# Patient Record
Sex: Female | Born: 1975 | Race: Black or African American | Hispanic: No | Smoking: Never smoker
Health system: Southern US, Community
[De-identification: ages and names within clinical notes are randomized; demographics above are authoritative.]

## PROBLEM LIST (undated history)

## (undated) DIAGNOSIS — E282 Polycystic ovarian syndrome: Secondary | ICD-10-CM

## (undated) DIAGNOSIS — R7303 Prediabetes: Secondary | ICD-10-CM

## (undated) DIAGNOSIS — K219 Gastro-esophageal reflux disease without esophagitis: Secondary | ICD-10-CM

## (undated) DIAGNOSIS — R0789 Other chest pain: Secondary | ICD-10-CM

## (undated) DIAGNOSIS — Z5189 Encounter for other specified aftercare: Secondary | ICD-10-CM

## (undated) DIAGNOSIS — I10 Essential (primary) hypertension: Secondary | ICD-10-CM

## (undated) HISTORY — PX: ECTOPIC PREGNANCY SURGERY: SHX613

## (undated) HISTORY — DX: Prediabetes: R73.03

## (undated) HISTORY — PX: DIAGNOSTIC LAPAROSCOPY: SUR761

## (undated) HISTORY — DX: Morbid (severe) obesity due to excess calories: E66.01

---

## 2002-08-10 ENCOUNTER — Encounter: Payer: Self-pay | Admitting: Obstetrics and Gynecology

## 2002-08-10 ENCOUNTER — Inpatient Hospital Stay (HOSPITAL_COMMUNITY): Admission: AD | Admit: 2002-08-10 | Discharge: 2002-08-10 | Payer: Self-pay | Admitting: Obstetrics and Gynecology

## 2002-08-12 ENCOUNTER — Encounter: Admission: RE | Admit: 2002-08-12 | Discharge: 2002-08-16 | Payer: Self-pay | Admitting: Obstetrics and Gynecology

## 2002-08-16 ENCOUNTER — Encounter (HOSPITAL_COMMUNITY): Admission: RE | Admit: 2002-08-16 | Discharge: 2002-08-27 | Payer: Self-pay | Admitting: *Deleted

## 2002-08-16 ENCOUNTER — Encounter: Payer: Self-pay | Admitting: *Deleted

## 2002-08-19 ENCOUNTER — Encounter: Payer: Self-pay | Admitting: *Deleted

## 2002-08-25 ENCOUNTER — Inpatient Hospital Stay (HOSPITAL_COMMUNITY): Admission: AD | Admit: 2002-08-25 | Discharge: 2002-08-25 | Payer: Self-pay | Admitting: *Deleted

## 2002-08-26 ENCOUNTER — Encounter: Payer: Self-pay | Admitting: *Deleted

## 2002-08-26 ENCOUNTER — Inpatient Hospital Stay (HOSPITAL_COMMUNITY): Admission: RE | Admit: 2002-08-26 | Discharge: 2002-08-29 | Payer: Self-pay | Admitting: *Deleted

## 2002-09-22 ENCOUNTER — Inpatient Hospital Stay (HOSPITAL_COMMUNITY): Admission: AD | Admit: 2002-09-22 | Discharge: 2002-09-22 | Payer: Self-pay | Admitting: *Deleted

## 2003-04-09 ENCOUNTER — Encounter (INDEPENDENT_AMBULATORY_CARE_PROVIDER_SITE_OTHER): Payer: Self-pay | Admitting: *Deleted

## 2003-04-09 LAB — CONVERTED CEMR LAB

## 2003-04-13 ENCOUNTER — Encounter (INDEPENDENT_AMBULATORY_CARE_PROVIDER_SITE_OTHER): Payer: Self-pay | Admitting: Specialist

## 2003-04-13 ENCOUNTER — Other Ambulatory Visit: Admission: RE | Admit: 2003-04-13 | Discharge: 2003-04-13 | Payer: Self-pay | Admitting: Obstetrics & Gynecology

## 2003-04-13 ENCOUNTER — Encounter: Payer: Self-pay | Admitting: Obstetrics & Gynecology

## 2003-04-13 ENCOUNTER — Inpatient Hospital Stay (HOSPITAL_COMMUNITY): Admission: AD | Admit: 2003-04-13 | Discharge: 2003-04-13 | Payer: Self-pay | Admitting: *Deleted

## 2003-04-18 ENCOUNTER — Encounter (INDEPENDENT_AMBULATORY_CARE_PROVIDER_SITE_OTHER): Payer: Self-pay

## 2003-04-18 ENCOUNTER — Encounter: Admission: RE | Admit: 2003-04-18 | Discharge: 2003-04-18 | Payer: Self-pay | Admitting: *Deleted

## 2003-05-04 ENCOUNTER — Encounter: Admission: RE | Admit: 2003-05-04 | Discharge: 2003-08-02 | Payer: Self-pay | Admitting: *Deleted

## 2003-05-18 ENCOUNTER — Encounter: Admission: RE | Admit: 2003-05-18 | Discharge: 2003-05-18 | Payer: Self-pay | Admitting: Family Medicine

## 2003-06-08 ENCOUNTER — Encounter: Admission: RE | Admit: 2003-06-08 | Discharge: 2003-06-08 | Payer: Self-pay | Admitting: *Deleted

## 2003-06-15 ENCOUNTER — Encounter: Admission: RE | Admit: 2003-06-15 | Discharge: 2003-06-15 | Payer: Self-pay | Admitting: Family Medicine

## 2003-06-20 ENCOUNTER — Ambulatory Visit (HOSPITAL_COMMUNITY): Admission: RE | Admit: 2003-06-20 | Discharge: 2003-06-20 | Payer: Self-pay | Admitting: Family Medicine

## 2003-06-29 ENCOUNTER — Encounter: Admission: RE | Admit: 2003-06-29 | Discharge: 2003-06-29 | Payer: Self-pay | Admitting: *Deleted

## 2003-07-07 ENCOUNTER — Encounter: Admission: RE | Admit: 2003-07-07 | Discharge: 2003-07-07 | Payer: Self-pay | Admitting: Family Medicine

## 2003-08-17 ENCOUNTER — Inpatient Hospital Stay (HOSPITAL_COMMUNITY): Admission: RE | Admit: 2003-08-17 | Discharge: 2003-08-17 | Payer: Self-pay | Admitting: Family Medicine

## 2003-08-17 ENCOUNTER — Encounter: Admission: RE | Admit: 2003-08-17 | Discharge: 2003-08-17 | Payer: Self-pay | Admitting: *Deleted

## 2003-08-21 ENCOUNTER — Encounter: Admission: RE | Admit: 2003-08-21 | Discharge: 2003-08-21 | Payer: Self-pay | Admitting: *Deleted

## 2003-08-24 ENCOUNTER — Inpatient Hospital Stay (HOSPITAL_COMMUNITY): Admission: AD | Admit: 2003-08-24 | Discharge: 2003-08-26 | Payer: Self-pay | Admitting: Family Medicine

## 2003-08-24 ENCOUNTER — Encounter: Admission: RE | Admit: 2003-08-24 | Discharge: 2003-08-24 | Payer: Self-pay | Admitting: *Deleted

## 2003-10-23 ENCOUNTER — Encounter: Admission: RE | Admit: 2003-10-23 | Discharge: 2003-10-23 | Payer: Self-pay | Admitting: Family Medicine

## 2003-12-06 ENCOUNTER — Encounter: Admission: RE | Admit: 2003-12-06 | Discharge: 2003-12-06 | Payer: Self-pay | Admitting: Family Medicine

## 2003-12-15 ENCOUNTER — Encounter: Admission: RE | Admit: 2003-12-15 | Discharge: 2003-12-15 | Payer: Self-pay | Admitting: Family Medicine

## 2003-12-20 ENCOUNTER — Encounter: Admission: RE | Admit: 2003-12-20 | Discharge: 2003-12-20 | Payer: Self-pay | Admitting: Family Medicine

## 2003-12-20 IMAGING — US US OB FOLLOW-UP
1 series · 13 of 28 positions shown · non-contrast
Comparison: none

CLINICAL DATA: 39 week 3 day assigned gestational age.  Gestational diabetes.  Evaluate growth and biophysical profile.

[Series 1: unknown · 13 of 33 slices shown]
[im 2/33]
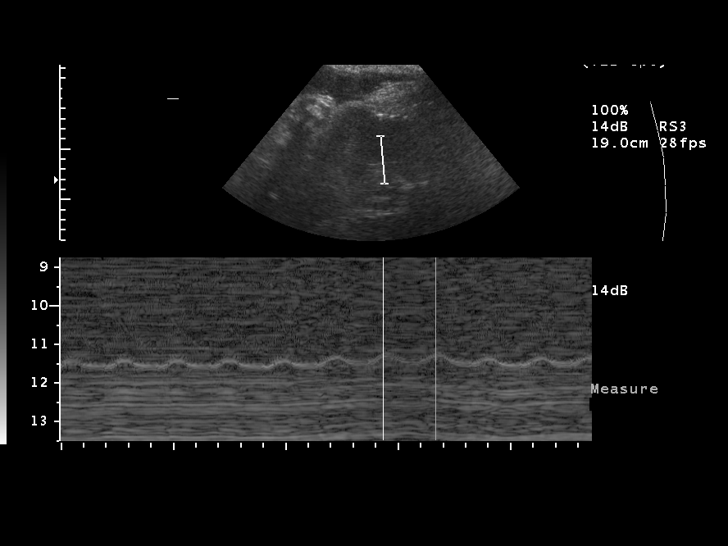
[im 4/33]
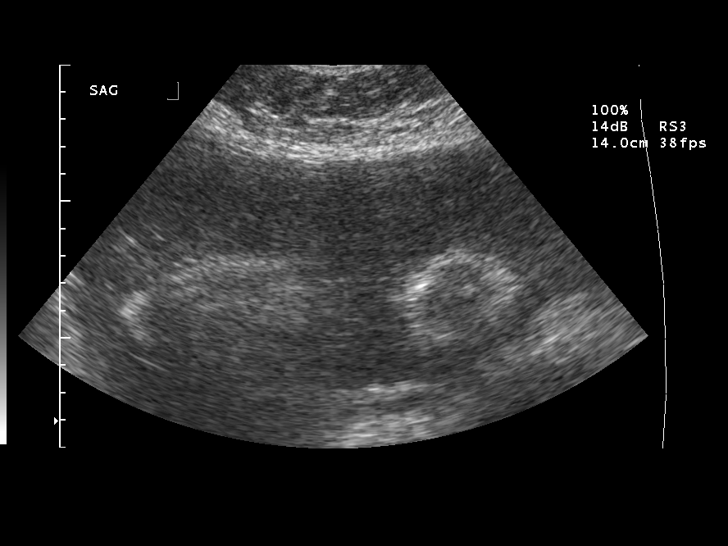
[im 6/33]
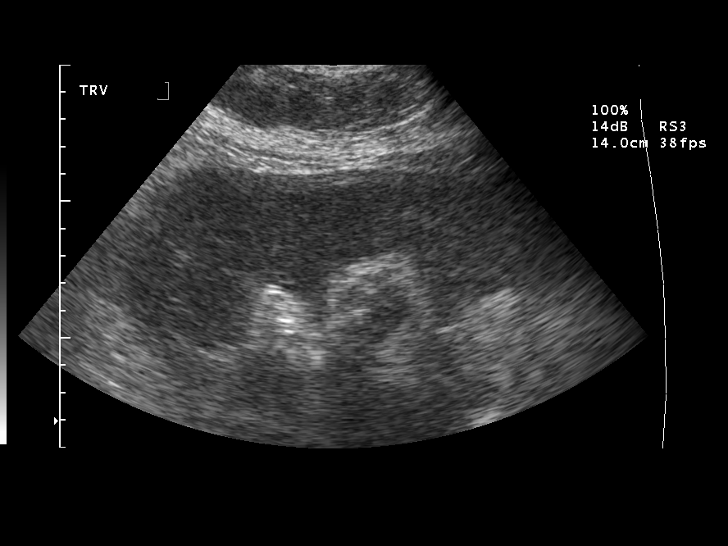
[im 9/33]
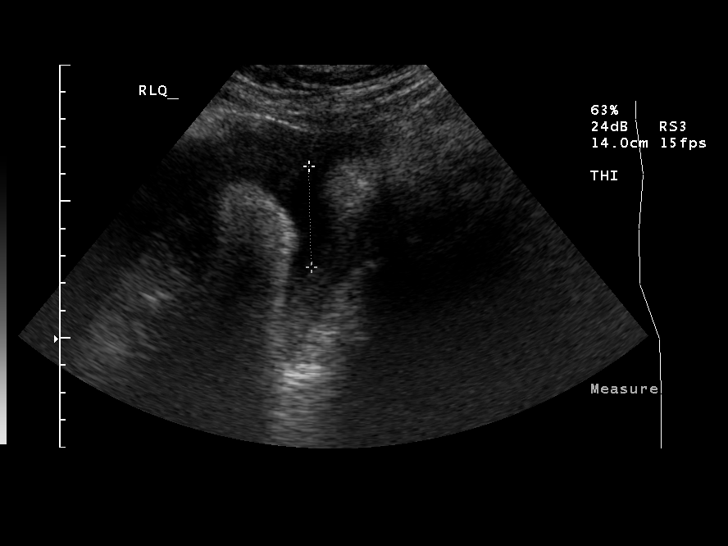
[im 11/33]
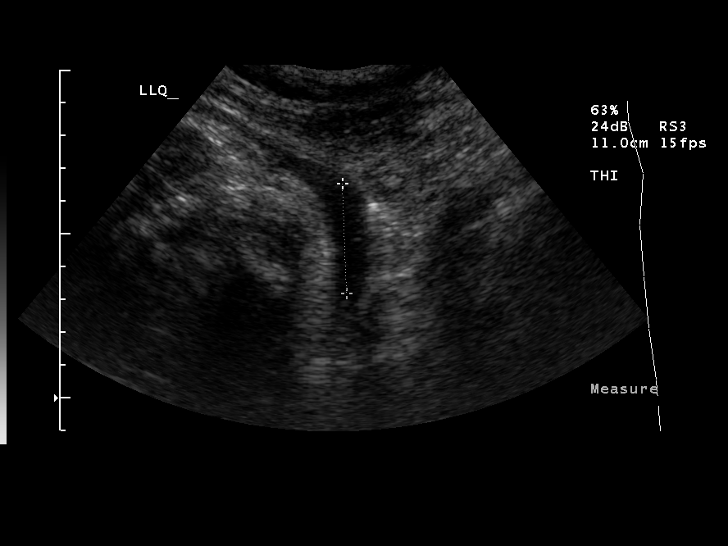
[im 14/33]
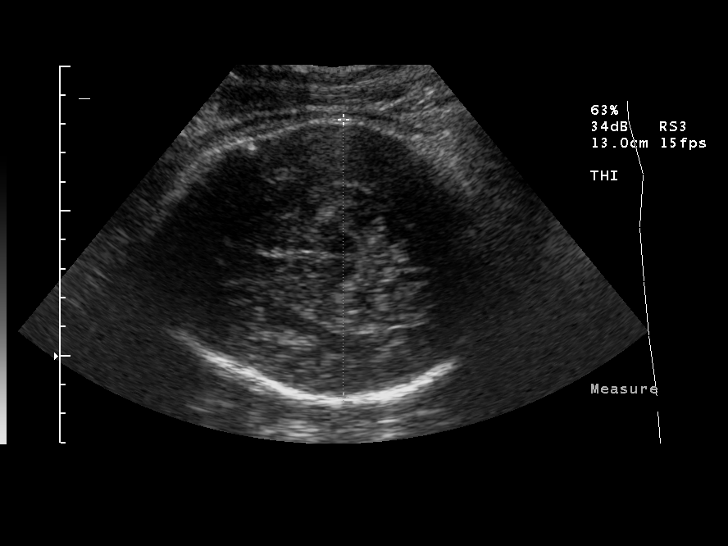
[im 17/33]
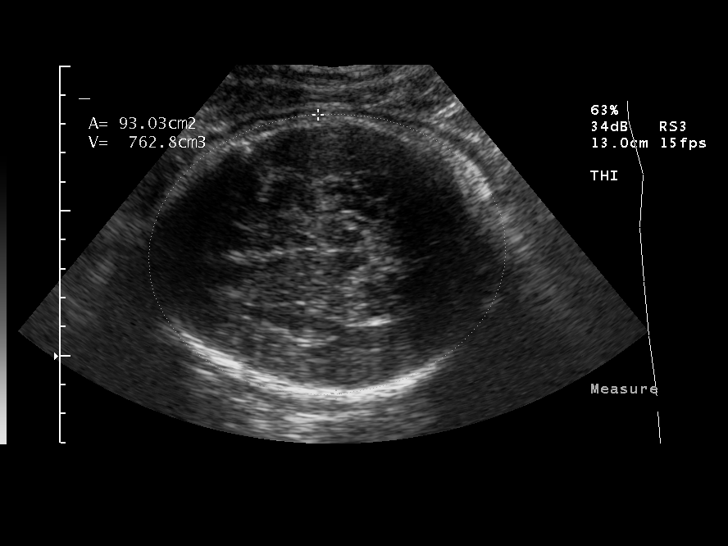
[im 19/33]
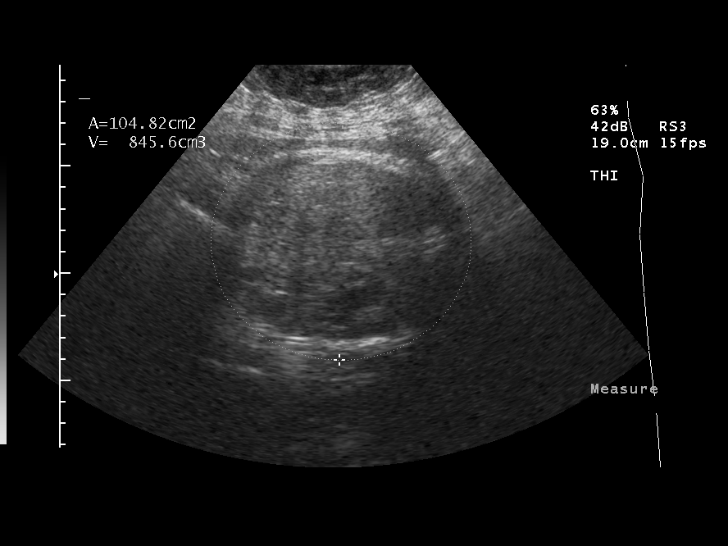
[im 22/33]
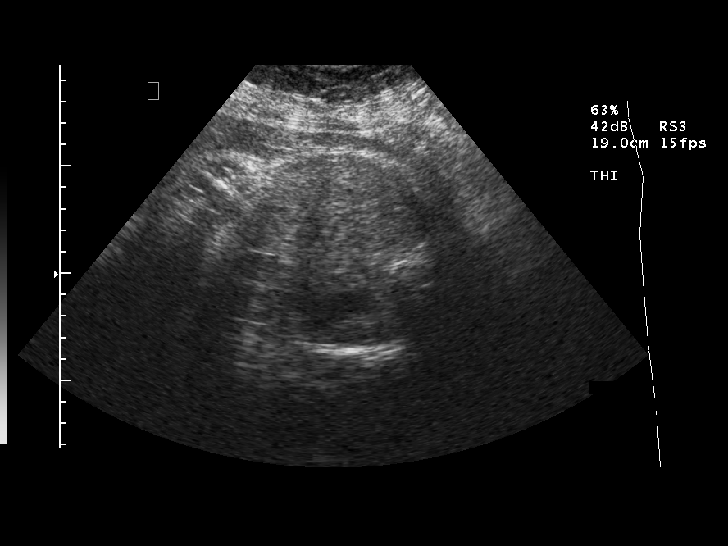
[im 24/33]
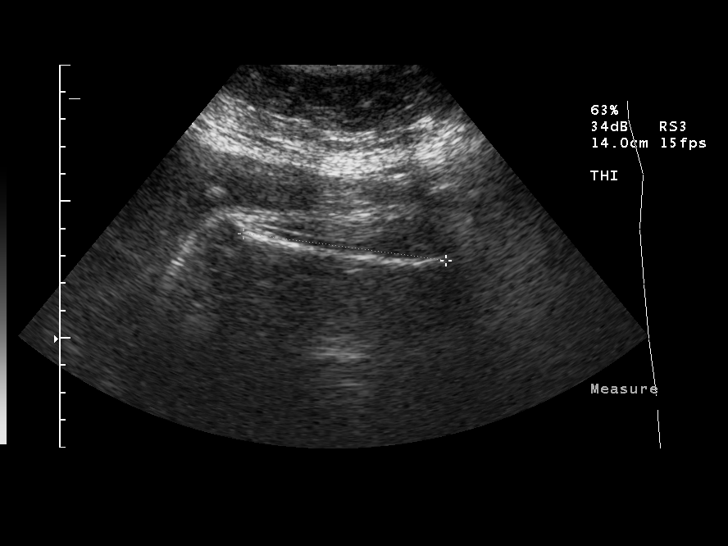
[im 27/33]
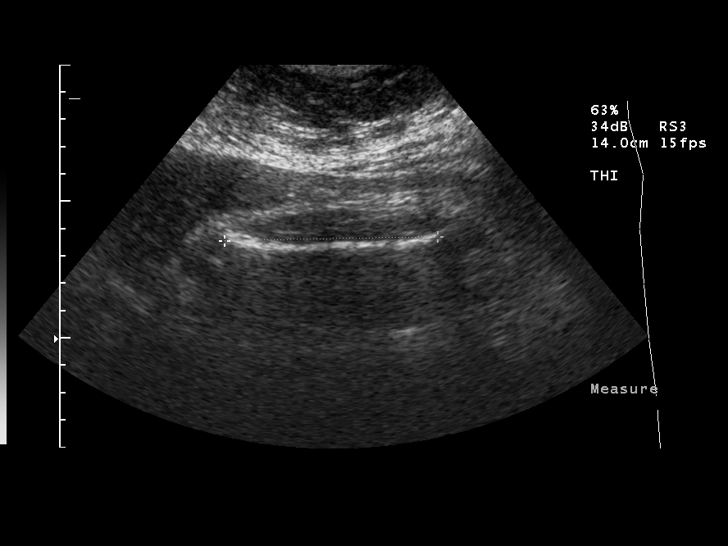
[im 29/33]
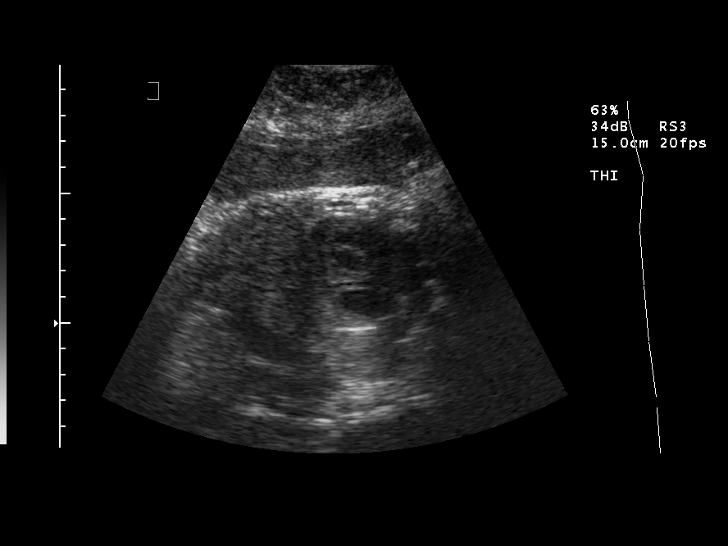
[im 31/33]
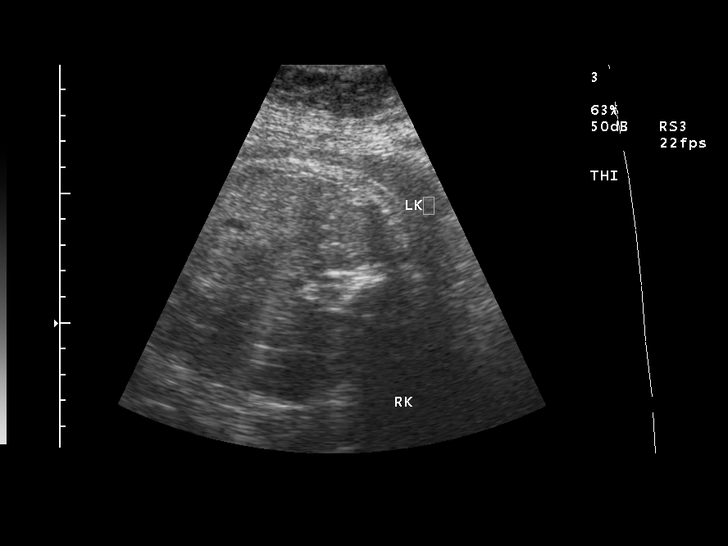

[13 of 28 positions shown; findings below may reference images not displayed]

OBSTETRICAL ULTRASOUND REEVALUATION
NUMBER OF FETUSES:  1
HEART RATE:  153
MOVEMENT:  Yes
BREATHING:  Yes  
PRESENTATION:  Cephalic
PLACENTAL LOCATION:  Anterior
GRADE:  I
PREVIA:  No
AMNIOTIC FLUID (SUBJECTIVE):  Borderline decreased
AMNIOTIC FLUID (OBJECTIVE):   7.1 cm AFI (5th - 95th%ile =   7.2 ? 22.6 cm for 39 wks)

FETAL BIOMETRY
BPD:  9.6 cm  39 w 1 d
HC:  35.0 cm   40 w 2 d
AC:  35.0 cm  39 w 2 d
FL:  7.8 cm   39 w 5 d
MEAN GA:      39 w 4 d
EFW:  5959 G (S) 75th ? 90th%ile (1988 ? 4516 g) For 39 weeks

FETAL ANATOMY
LATERAL VENTRICLES:    Visualized 
THALAMI/CSP:      Previously seen 
POSTERIOR FOSSA:    Not visualized 
NUCHAL REGION:    Not visualized 
SPINE:      Previously seen 
4 CHAMBER HEART ON LEFT:      Previously seen 
STOMACH ON LEFT:      Visualized 
3 VESSEL CORD:    Previously seen 
CORD INSERTION SITE:    Previously seen 
KIDNEYS:    Visualized 
BLADDER:    Visualized 
EXTREMITIES:      Previously seen 

MATERNAL FINDINGS
CERVIX:   Not evaluated (> 37 weeks)
IMPRESSION: Assigned gestational age is currently 39 weeks 3 days.  Appropriate fetal growth, with EFW slightly greater than 75th percentile.  
Borderline decreased amniotic fluid volume, with AFI of 7.1 cm.
BIOPHYSICAL PROFILE

MOVEMENTS:  2    Time:  15 minutes
BREATHING:  2
TONE:  2
AMNIOTIC FLUID:  2
TOTAL SCORE:  8
IMPRESSION
Biophysical profile score [DATE].

## 2004-01-09 ENCOUNTER — Encounter (INDEPENDENT_AMBULATORY_CARE_PROVIDER_SITE_OTHER): Payer: Self-pay | Admitting: *Deleted

## 2004-01-09 ENCOUNTER — Encounter: Admission: RE | Admit: 2004-01-09 | Discharge: 2004-01-09 | Payer: Self-pay | Admitting: Family Medicine

## 2004-01-19 ENCOUNTER — Encounter: Admission: RE | Admit: 2004-01-19 | Discharge: 2004-01-19 | Payer: Self-pay | Admitting: Family Medicine

## 2004-01-26 ENCOUNTER — Encounter: Admission: RE | Admit: 2004-01-26 | Discharge: 2004-01-26 | Payer: Self-pay | Admitting: Sports Medicine

## 2004-03-15 ENCOUNTER — Encounter: Admission: RE | Admit: 2004-03-15 | Discharge: 2004-03-15 | Payer: Self-pay | Admitting: Family Medicine

## 2004-08-07 ENCOUNTER — Ambulatory Visit: Payer: Self-pay | Admitting: Family Medicine

## 2004-10-15 ENCOUNTER — Ambulatory Visit: Payer: Self-pay | Admitting: Family Medicine

## 2004-12-30 ENCOUNTER — Ambulatory Visit: Payer: Self-pay | Admitting: Family Medicine

## 2005-01-03 ENCOUNTER — Emergency Department (HOSPITAL_COMMUNITY): Admission: EM | Admit: 2005-01-03 | Discharge: 2005-01-03 | Payer: Self-pay | Admitting: Family Medicine

## 2005-01-09 ENCOUNTER — Encounter: Admission: RE | Admit: 2005-01-09 | Discharge: 2005-01-09 | Payer: Self-pay | Admitting: Sports Medicine

## 2005-01-09 ENCOUNTER — Ambulatory Visit: Payer: Self-pay | Admitting: Family Medicine

## 2005-05-09 ENCOUNTER — Emergency Department (HOSPITAL_COMMUNITY): Admission: EM | Admit: 2005-05-09 | Discharge: 2005-05-09 | Payer: Self-pay | Admitting: Emergency Medicine

## 2005-07-21 ENCOUNTER — Encounter: Admission: RE | Admit: 2005-07-21 | Discharge: 2005-07-21 | Payer: Self-pay | Admitting: Family Medicine

## 2005-10-13 ENCOUNTER — Emergency Department (HOSPITAL_COMMUNITY): Admission: EM | Admit: 2005-10-13 | Discharge: 2005-10-14 | Payer: Self-pay | Admitting: Emergency Medicine

## 2006-01-27 ENCOUNTER — Emergency Department (HOSPITAL_COMMUNITY): Admission: EM | Admit: 2006-01-27 | Discharge: 2006-01-27 | Payer: Self-pay | Admitting: Emergency Medicine

## 2006-08-24 ENCOUNTER — Emergency Department (HOSPITAL_COMMUNITY): Admission: EM | Admit: 2006-08-24 | Discharge: 2006-08-24 | Payer: Self-pay | Admitting: Emergency Medicine

## 2006-11-06 ENCOUNTER — Encounter (INDEPENDENT_AMBULATORY_CARE_PROVIDER_SITE_OTHER): Payer: Self-pay | Admitting: *Deleted

## 2008-06-21 ENCOUNTER — Emergency Department (HOSPITAL_COMMUNITY): Admission: EM | Admit: 2008-06-21 | Discharge: 2008-06-21 | Payer: Self-pay | Admitting: Emergency Medicine

## 2008-06-21 ENCOUNTER — Ambulatory Visit: Payer: Self-pay | Admitting: *Deleted

## 2008-06-21 ENCOUNTER — Inpatient Hospital Stay (HOSPITAL_COMMUNITY): Admission: RE | Admit: 2008-06-21 | Discharge: 2008-06-23 | Payer: Self-pay | Admitting: *Deleted

## 2008-11-12 ENCOUNTER — Emergency Department (HOSPITAL_COMMUNITY): Admission: EM | Admit: 2008-11-12 | Discharge: 2008-11-13 | Payer: Self-pay | Admitting: Emergency Medicine

## 2008-11-28 ENCOUNTER — Encounter: Admission: RE | Admit: 2008-11-28 | Discharge: 2008-11-28 | Payer: Self-pay | Admitting: Certified Nurse Midwife

## 2008-12-31 ENCOUNTER — Emergency Department (HOSPITAL_COMMUNITY): Admission: EM | Admit: 2008-12-31 | Discharge: 2009-01-01 | Payer: Self-pay | Admitting: Emergency Medicine

## 2009-05-05 IMAGING — CR DG CHEST 2V
2 series · 2 of 2 positions shown · non-contrast
Comparison: PA and lateral chest 01/27/2006.

CLINICAL DATA: Chest pain.

CHEST - 2 VIEW

[w chest pa]
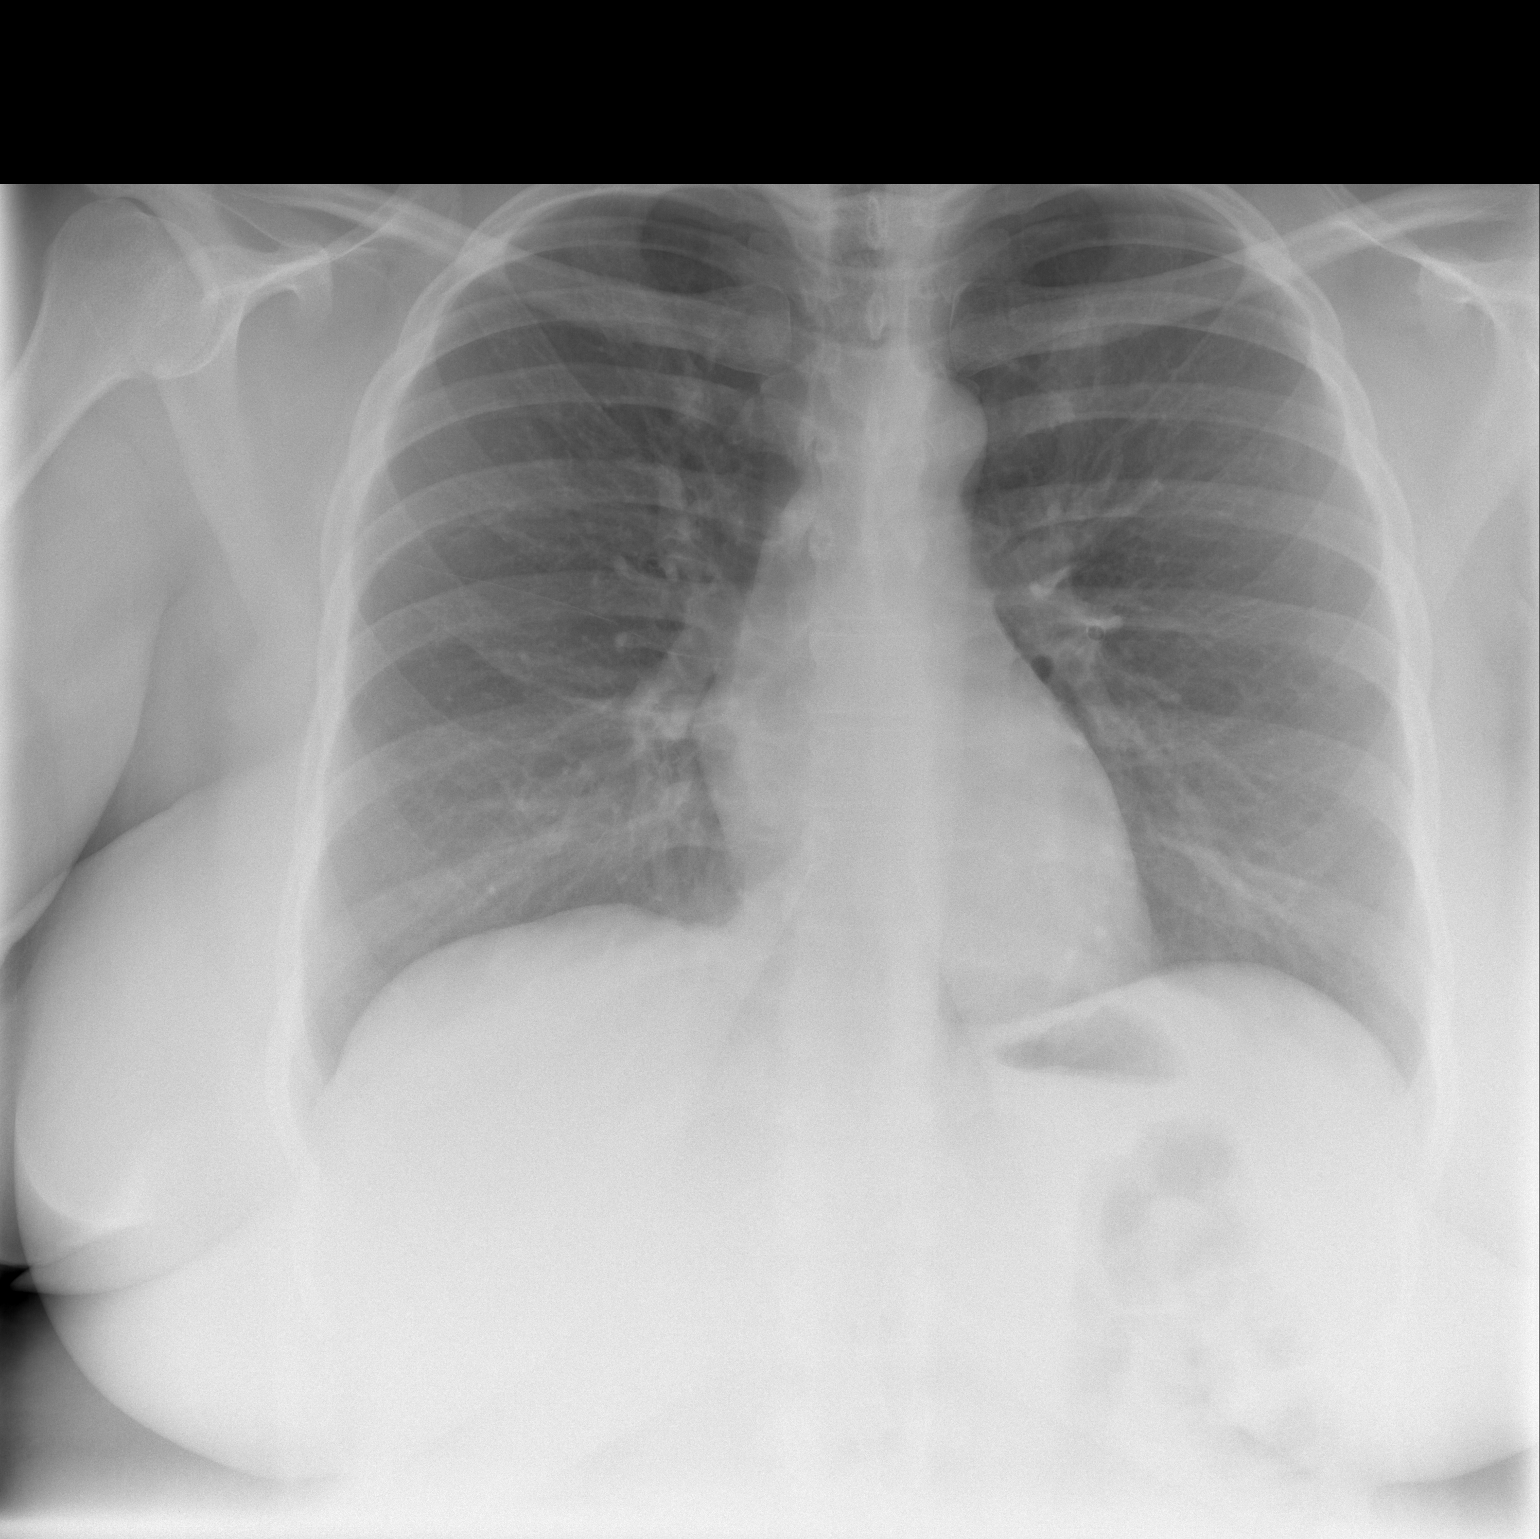

[w chest lat]
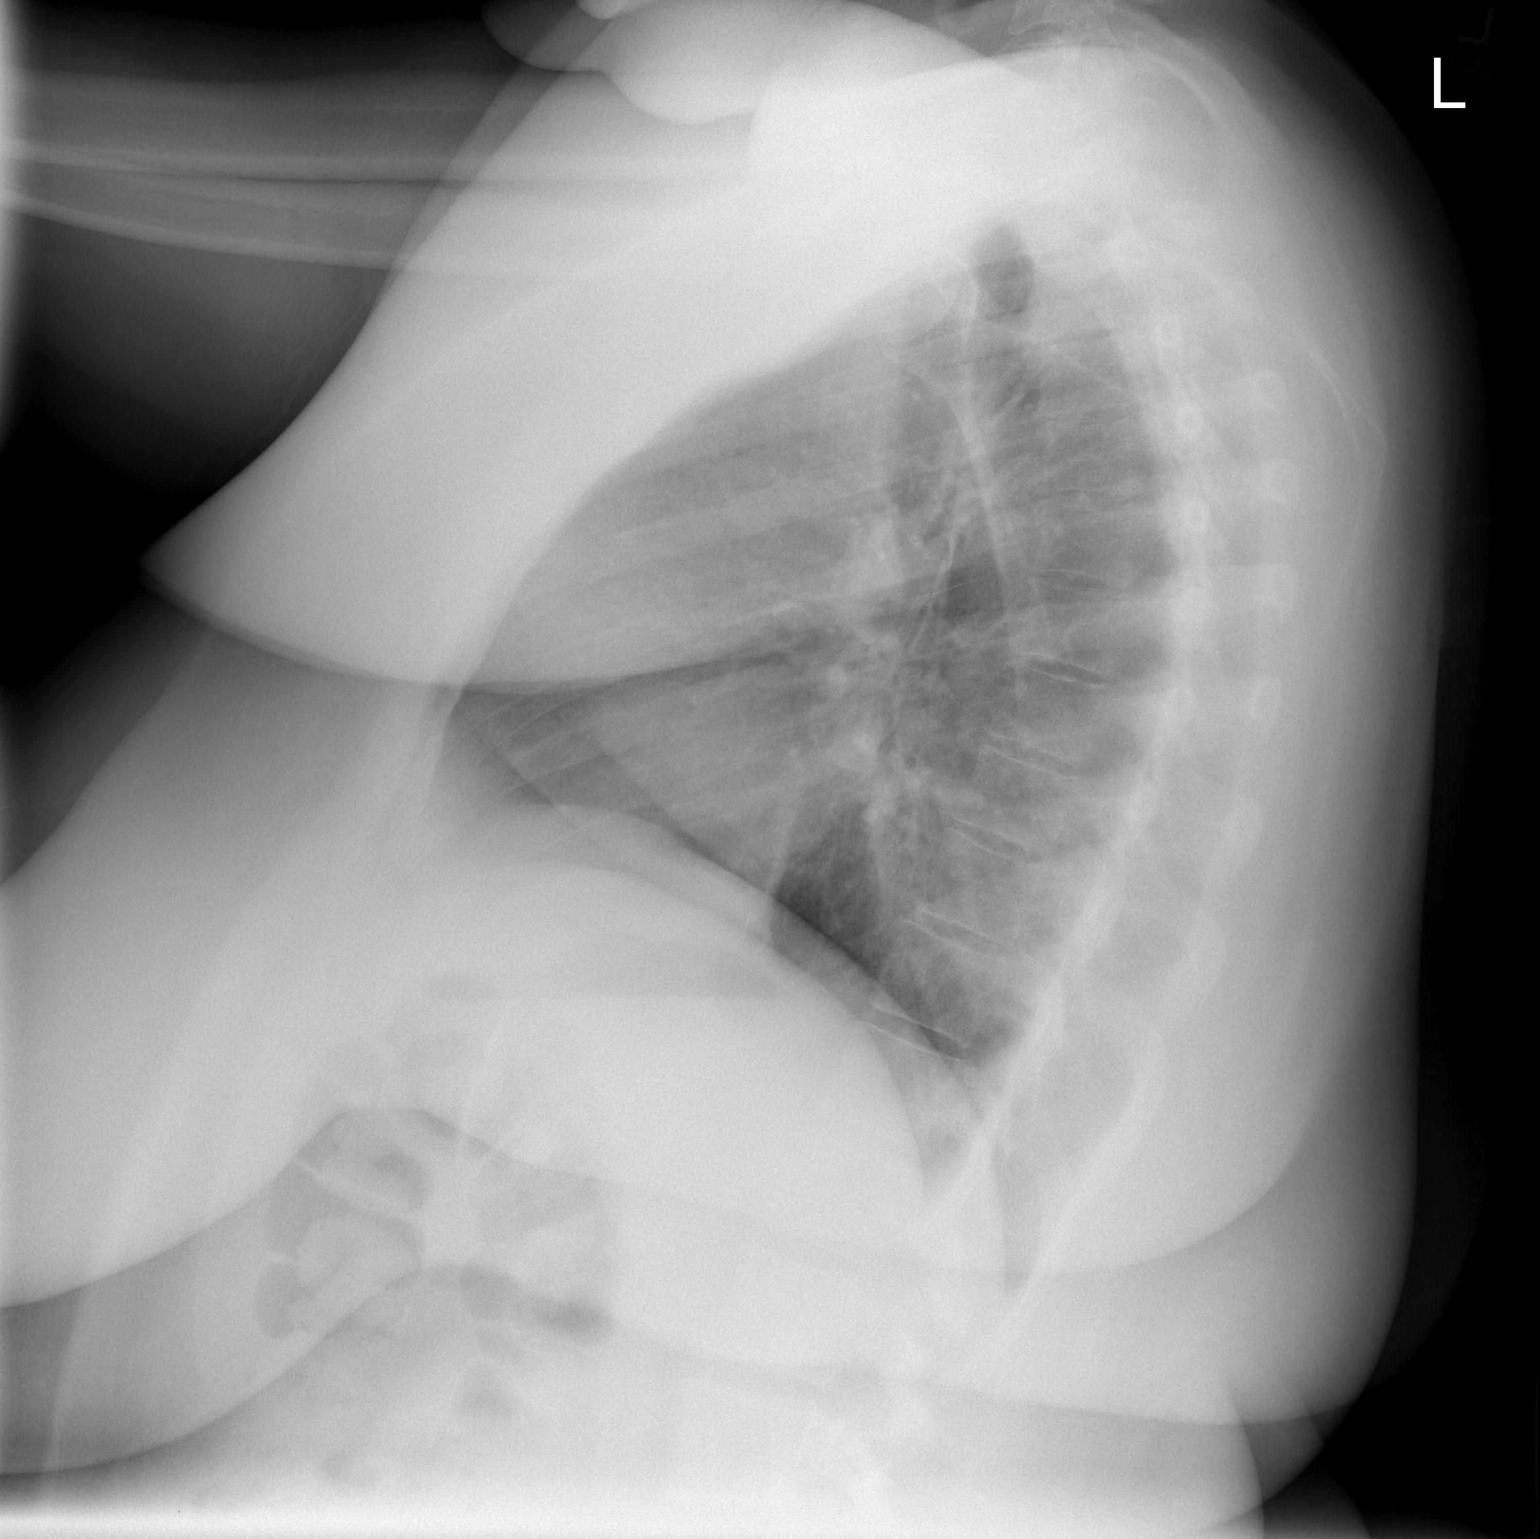

[2 of 2 positions shown; findings below may reference images not displayed]

FINDINGS: The lungs are clear.  Heart size is normal.  No pleural
effusion or focal bony abnormality.
IMPRESSION: Negative chest.

## 2010-12-18 LAB — URINALYSIS, ROUTINE W REFLEX MICROSCOPIC
Bilirubin Urine: NEGATIVE
Glucose, UA: NEGATIVE mg/dL
Hgb urine dipstick: NEGATIVE
Ketones, ur: NEGATIVE mg/dL
Nitrite: NEGATIVE
Protein, ur: NEGATIVE mg/dL
Specific Gravity, Urine: 1.019 (ref 1.005–1.030)
Urobilinogen, UA: 0.2 mg/dL (ref 0.0–1.0)
pH: 7 (ref 5.0–8.0)

## 2010-12-18 LAB — POCT I-STAT, CHEM 8
BUN: 9 mg/dL (ref 6–23)
Calcium, Ion: 1.01 mmol/L — ABNORMAL LOW (ref 1.12–1.32)
Chloride: 102 mEq/L (ref 96–112)
Creatinine, Ser: <0.2 mg/dL — ABNORMAL LOW (ref 0.4–1.2)
Glucose, Bld: 113 mg/dL — ABNORMAL HIGH (ref 70–99)
HCT: 40 % (ref 36.0–46.0)
Hemoglobin: 13.6 g/dL (ref 12.0–15.0)
Potassium: 3.6 mEq/L (ref 3.5–5.1)
Sodium: 135 mEq/L (ref 135–145)
TCO2: 13 mmol/L (ref 0–100)

## 2010-12-18 LAB — POCT PREGNANCY, URINE: Preg Test, Ur: NEGATIVE

## 2010-12-18 LAB — POCT CARDIAC MARKERS
CKMB, poc: <1 ng/mL — ABNORMAL LOW (ref 1.0–8.0)
Myoglobin, poc: 46.8 ng/mL (ref 12–200)
Troponin i, poc: <0.05 ng/mL (ref 0.00–0.09)

## 2010-12-19 LAB — URINALYSIS, ROUTINE W REFLEX MICROSCOPIC
Bilirubin Urine: NEGATIVE
Glucose, UA: NEGATIVE mg/dL
Nitrite: POSITIVE — AB
Protein, ur: NEGATIVE mg/dL
Specific Gravity, Urine: 1.013 (ref 1.005–1.030)
Urobilinogen, UA: 1 mg/dL (ref 0.0–1.0)
pH: 6 (ref 5.0–8.0)

## 2010-12-19 LAB — URINE MICROSCOPIC-ADD ON

## 2010-12-19 LAB — PREGNANCY, URINE: Preg Test, Ur: NEGATIVE

## 2010-12-19 LAB — URINE CULTURE
Colony Count: NO GROWTH
Culture: NO GROWTH

## 2011-01-21 NOTE — Discharge Summary (Signed)
Lynn Pearson, FUNDORA NO.:  [id number]   MEDICAL RECORD NO.:  [id number]          PATIENT TYPE:  IPS   LOCATION:  0303                          FACILITY:  [facility]   PHYSICIAN:  Jasmine Pang, M.D. DATE OF BIRTH:  04-08-1976   DATE OF ADMISSION:  06/21/2008  DATE OF DISCHARGE:  06/23/2008                               DISCHARGE SUMMARY   IDENTIFICATION:  This is a 35 year old married African American female  who was admitted on a voluntary basis on June 21, 2008.   HISTORY OF PRESENT ILLNESS:  The patient had a history of an intentional  overdose on diet pills and pain pills, they were 28 pills all together,  she states.  This was impulsive.  She was not sure what would happened.  She began to have side effects from the overdose.  After sleeping for a  while, she had her son called 911 and hands had begun feeling numb.  Sleep is okay.  She gets up early to speak to her husband in [place] and he  recently returned home and is currently in [place].  She reports  conflict with her husband.  She states she picks fights with him.  She  described being impulsive during the summer spending up to 5000 dollars.   PAST PSYCHIATRIC HISTORY:  This is the first [facility] admission for the  patient.  She is in no current outpatient treatment.   FAMILY HISTORY:  Mother is depressed.   ALCOHOL AND DRUG HISTORY:  She denies alcohol or drug use.   MEDICAL PROBLEMS:  Menorrhagia.   MEDICATIONS:  Prenatal vitamins and metformin to regulate her periods  and a diet pill, she does not know the name of.   DRUG ALLERGIES:  No known drug allergies.   PHYSICAL FINDINGS:  There were no acute physical or medical problems  noted.  Her physical exam was done at [facility] ED.   ADMISSION LABORATORIES:  Urinalysis, ketones were greater than 80 with a  small amount of leukocyte esterase and small amount of blood.  CBC  revealed hemoglobin of 11.3 and hematocrit 34%.  APAP level was less  than 10.  Comprehensive metabolic panel was positive for potassium of  2.8, BUN 5, glucose 115, otherwise normal.  Blood alcohol level was less  than 5.  Urine pregnancy test was negative.  Urine drug screen was  negative for all drugs.   HOSPITAL COURSE:  Upon admission, the patient was started on Ambien 10  mg p.o. q.h.s. p.r.n. insomnia.  She was also placed on Ativan 1 mg p.o.  q.6 hours for anxiety and agitation.  She was also placed on metformin  500 mg p.o. b.i.d.  The patient tolerated her medications well with no  significant side effects.  In individual sessions, the patient was  friendly and cooperative.  She also participated appropriately in unit  therapeutic groups and activities.  She says things are okay with her  husband now, but they have been rocky.  They have been arguing a lot and  had a lot of conflict.  She has 4 children, ages 32, 20, 47, and 4 years  old.  Her stressors revolve around, husband recently being in a rack.  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  She planned to return home to live with her  husband and was going to go to [facility] for a  followup therapy.  She can request a psychiatrist at this appointment if  she wants.  On June 23, 2008, sleep revealed some middle of the night  awakening.  Appetite was good.  Mood was less depressed less anxious.  Affect was consistent with mood.  There was no suicidal or homicidal  ideation.  No thoughts of self-injurious behavior.  No auditory or  visual hallucinations.  No paranoia or delusions.  Thoughts were logical  and goal-directed.  Thought content no predominant theme.  Cognitive was  grossly intact.  Insight good.  Judgment good.  Impulse control good.  She had a family session with her husband prior to discharge.  There was  some conflict during the session as husband confronted her about her  negative behaviors towards him.   However, they ended with her husband  telling her he loves her and wanting to continue with the marriage.  The  patient declined psychiatric medication and she stated she did not want  to be on medications at this point and hopes to deal with her problems  in therapy.   DISCHARGE DIAGNOSES:  Axis I:  Mood disorder, not otherwise specified.  Axis II:  None.  Axis III:  Irregular and painful menstrual periods.  Axis IV:  Moderate (problems with primary support group, occupational  problem, burden of psychiatric illness, burden of medical problems).  Axis V: Global assessment of functioning was 50 upon discharge.  GAF was  40 upon admission.  GAF highest past year was 65.   DISCHARGE PLANS:  There was no specific activity level or dietary  restrictions.   POSTHOSPITAL CARE PLANS:  The patient will go to the [facility]sel[facility] to see Quenton Fetter for therapy on October 20th at 4:30 p.m.  Should she decide she wants to start medications she can begin.  She can  see a psychiatrist at that point.   DISCHARGE MEDICATIONS:  Glucophage 500 mg p.o. b.i.d.   The patient did not want to be on any psychiatric medications at this  time.  She felt she was on enough medications as it was.      Jasmine Pang, M.D.  Electronically Signed     BHS/MEDQ  D:  06/23/2008  T:  06/24/2008  Job:  [id number]

## 2011-01-24 NOTE — Discharge Summary (Signed)
   NAME:  Lynn Pearson, Lynn Pearson                      ACCOUNT NO.:  [id number]   MEDICAL RECORD NO.:  [id number]                   PATIENT TYPE:  INP   LOCATION:  9107                                 FACILITY:  [facility]   PHYSICIAN:  Barney Drain, M.D.                 DATE OF BIRTH:  1976-04-20   DATE OF ADMISSION:  08/26/2002  DATE OF DISCHARGE:  08/29/2002                                 DISCHARGE SUMMARY   DISCHARGE DIAGNOSES:  1. Gestational diabetes.  2. Postpartum hemorrhage.   DISCHARGE MEDICATIONS:  1. Ibuprofen 600 mg q.6h. p.r.n. pain.  2. Micronor one p.o. daily while breast-feeding.  3. Prenatal vitamins one tab p.o. daily while breast-feeding.   DISPOSITION:  The patient was discharged home in stable condition.   FOLLOWUP:  She is to follow up at [facility] in six weeks.   HISTORY OF PRESENT ILLNESS:  The patient is a 35 year old G8, P2-0-5-2, who  presented at 39-1/[redacted] weeks gestation.  She was referred from the [facility] after having a biophysical profile with results of 6/8 with minimal  movement and tone.  AFI was 6.6, this was significantly decreased from  08/19/02, when it was 15.6.  The patient has excellent glycemic control.   PHYSICAL EXAMINATION:  VITAL SIGNS:  Blood pressure 133/64.  ABDOMEN:  Soft.  EXTREMITIES:  Deep tendon reflexes 1+.  PELVIC:  Digital cervical examination:  The patient had a posterior cervix  which was 1 cm long, -3 station, cephalic presentation.  Baseline fetal  heart rate was 120 beats per minute and was reactive with average  variability.   LABORATORY DATA:  O positive blood type.  GBS negative on 08/10/02.   HOSPITAL COURSE:  Problem 1.  SPONTANEOUS VAGINAL DELIVERY OF FULL-TERM  INTRAUTERINE PREGNANCY:  Delivery was complicated by uterine atony that  responded to IV Pitocin, IM Methargen, and uterine massage.  Problem 2.  GESTATIONAL DIABETES MELLITUS:  The patient showed excellent  glycemia control throughout her  admission.  She is to continue checking her  sugars with follow up appointment at [facility], and continue her  diabetic diet.   LABORATORY DATA:  White blood cell count of 9.7, hemoglobin 8.8, hematocrit  26.2.                                               Barney Drain, M.D.    SS/MEDQ  D:  10/02/2002  T:  10/03/2002  Job:  [id number]

## 2011-06-10 ENCOUNTER — Ambulatory Visit: Payer: Self-pay | Admitting: *Deleted

## 2011-06-10 LAB — DIFFERENTIAL
Basophils Absolute: 0
Basophils Relative: 0
Eosinophils Absolute: 0
Eosinophils Relative: 0
Lymphocytes Relative: 26
Lymphs Abs: 1.8
Monocytes Absolute: 0.4
Monocytes Relative: 6
Neutro Abs: 4.7
Neutrophils Relative %: 67

## 2011-06-10 LAB — URINE MICROSCOPIC-ADD ON

## 2011-06-10 LAB — URINALYSIS, ROUTINE W REFLEX MICROSCOPIC
Glucose, UA: NEGATIVE
Ketones, ur: >80 — AB
Protein, ur: NEGATIVE
Urobilinogen, UA: 0.2

## 2011-06-10 LAB — RAPID URINE DRUG SCREEN, HOSP PERFORMED
Amphetamines: NOT DETECTED
Barbiturates: NOT DETECTED
Benzodiazepines: NOT DETECTED
Cocaine: NOT DETECTED

## 2011-06-10 LAB — GLUCOSE, CAPILLARY: Glucose-Capillary: 118 — ABNORMAL HIGH

## 2011-06-10 LAB — COMPREHENSIVE METABOLIC PANEL
ALT: 18
AST: 24
Albumin: 4
Alkaline Phosphatase: 55
BUN: 5 — ABNORMAL LOW
CO2: 23
Calcium: 8.9
Chloride: 109
Creatinine, Ser: 0.8
GFR calc Af Amer: >60
GFR calc non Af Amer: >60
Glucose, Bld: 115 — ABNORMAL HIGH
Potassium: 2.8 — ABNORMAL LOW
Sodium: 140
Total Bilirubin: 0.6
Total Protein: 7.3

## 2011-06-10 LAB — PREGNANCY, URINE: Preg Test, Ur: NEGATIVE

## 2011-06-10 LAB — CBC
HCT: 34 — ABNORMAL LOW
Hemoglobin: 11.3 — ABNORMAL LOW
MCHC: 33.2
MCV: 84
Platelets: 220
RBC: 4.05
RDW: 13
WBC: 7

## 2011-06-10 LAB — ACETAMINOPHEN LEVEL: Acetaminophen (Tylenol), Serum: <10 — ABNORMAL LOW

## 2011-06-10 LAB — ETHANOL: Alcohol, Ethyl (B): <5

## 2011-06-12 ENCOUNTER — Encounter: Payer: BC Managed Care – PPO | Attending: Family Medicine | Admitting: *Deleted

## 2011-06-12 ENCOUNTER — Encounter: Payer: Self-pay | Admitting: *Deleted

## 2011-06-12 DIAGNOSIS — Z713 Dietary counseling and surveillance: Secondary | ICD-10-CM | POA: Insufficient documentation

## 2011-06-12 DIAGNOSIS — E119 Type 2 diabetes mellitus without complications: Secondary | ICD-10-CM | POA: Insufficient documentation

## 2011-06-12 NOTE — Progress Notes (Addendum)
  Medical Nutrition Therapy:  Appt start time: 0800 end time:  0900.  Assessment:  Primary concerns today: T2DM, New Onset; Obesity.  Pt here with A1c of 6.4%. States a goal weight of 175-185 lbs and intent to start phentermine if cleared by stress test next week. Pt works a sedentary job at [employer] and has been sedentary until recently. Reports previous meal skipping, but now consumes 3 meals/day.  States she is not yet diabetic ("I'm just borderline") and currently follows Weight Watchers eating plan. Likely not getting enough calories, protein.  Pt denies any pain at this time.  MEDICATIONS: Metformin 1000 mg/day (states she takes no other meds/supplements)   DIETARY INTAKE:  Usual eating pattern includes 3 meals and 3 snacks per day.  24-hr recall:  B ( AM): 1 c grapes, banana, 1/2 bagel w/ cream cheese; 1/2 c OJ  Snk ( AM): 100 cal Jonna Munro; water  L ( PM): Salad w/ 1 tbsp blue cheese dressing; water Snk ( PM): small apple D ( PM): Beef pot pie (Banquet); water Snk ( PM): 100 cal smart balance popcorn  Usual physical activity: Recently started walking at work (1 mi-20 min) at moderate pace - 5 d/wk; Wii Zumba (just started).  Estimated energy needs: 1400 calories 155-160 g carbohydrates 105 g protein 40 g fat  Progress Towards Goal(s):  In progress.   Nutritional Diagnosis:  [address]-3.3 Morbid obesity related to meal skipping and excessive CHO intake as evidenced by 24-hour recall and a BMI of 43.7 kg/m^2.    Intervention/Goals:  Follow Diabetes Meal Plan as instructed (yellow card).  Eat 3 meals and 2 snacks, every 3-5 hrs - AVOID meal skipping.   Limit carbohydrate intake to 45 grams carbohydrate/meal.  Limit carbohydrate intake to 15 grams carbohydrate/snack.  Add lean protein foods to all meals/snacks.  Monitor glucose levels as instructed by your doctor.  Aim for 20-30 mins of physical activity daily.  Handouts given during visit include:  Living with Diabetes -  [employer]  CHO/Pro snack sheet  30g, 45g CHO meal plans  [employer] Guide  Monitoring/Evaluation:  Dietary intake, exercise, A1c, BG trends, and body weight in 4 week(s).

## 2011-06-12 NOTE — Patient Instructions (Addendum)
Goals:  Follow Diabetes Meal Plan as instructed (yellow card).  Eat 3 meals and 2 snacks, every 3-5 hrs - AVOID meal skipping.   Limit carbohydrate intake to 45 grams carbohydrate/meal.  Limit carbohydrate intake to 15 grams carbohydrate/snack.  Add lean protein foods to all meals/snacks.  Monitor glucose levels as instructed by your doctor.  Aim for 20-30 mins of physical activity daily.

## 2011-07-10 ENCOUNTER — Ambulatory Visit: Payer: BC Managed Care – PPO | Admitting: *Deleted

## 2011-10-12 ENCOUNTER — Other Ambulatory Visit: Payer: Self-pay

## 2011-10-12 ENCOUNTER — Emergency Department (HOSPITAL_COMMUNITY): Payer: BC Managed Care – PPO

## 2011-10-12 ENCOUNTER — Encounter (HOSPITAL_COMMUNITY): Payer: Self-pay

## 2011-10-12 ENCOUNTER — Emergency Department (HOSPITAL_COMMUNITY)
Admission: EM | Admit: 2011-10-12 | Discharge: 2011-10-12 | Disposition: A | Discharge status: Home / Self Care | Payer: BC Managed Care – PPO | Attending: Emergency Medicine | Admitting: Emergency Medicine

## 2011-10-12 DIAGNOSIS — R0602 Shortness of breath: Secondary | ICD-10-CM | POA: Insufficient documentation

## 2011-10-12 DIAGNOSIS — I10 Essential (primary) hypertension: Secondary | ICD-10-CM | POA: Insufficient documentation

## 2011-10-12 DIAGNOSIS — Z79899 Other long term (current) drug therapy: Secondary | ICD-10-CM | POA: Insufficient documentation

## 2011-10-12 DIAGNOSIS — R079 Chest pain, unspecified: Secondary | ICD-10-CM | POA: Insufficient documentation

## 2011-10-12 DIAGNOSIS — E876 Hypokalemia: Secondary | ICD-10-CM | POA: Insufficient documentation

## 2011-10-12 DIAGNOSIS — R110 Nausea: Secondary | ICD-10-CM | POA: Insufficient documentation

## 2011-10-12 LAB — BASIC METABOLIC PANEL
BUN: 5 mg/dL — ABNORMAL LOW (ref 6–23)
CO2: 27 mEq/L (ref 19–32)
Calcium: 8.6 mg/dL (ref 8.4–10.5)
Creatinine, Ser: 0.52 mg/dL (ref 0.50–1.10)
GFR calc non Af Amer: >90 mL/min (ref >90)
Glucose, Bld: 181 mg/dL — ABNORMAL HIGH (ref 70–99)
Sodium: 138 mEq/L (ref 135–145)

## 2011-10-12 LAB — CBC
HCT: 42.7 % (ref 36.0–46.0)
Hemoglobin: 14.2 g/dL (ref 12.0–15.0)
MCH: 27.5 pg (ref 26.0–34.0)
MCHC: 33.3 g/dL (ref 30.0–36.0)
MCV: 82.6 fL (ref 78.0–100.0)
RBC: 5.17 MIL/uL — ABNORMAL HIGH (ref 3.87–5.11)

## 2011-10-12 LAB — POTASSIUM: Potassium: 3.4 mEq/L — ABNORMAL LOW (ref 3.5–5.1)

## 2011-10-12 LAB — POCT I-STAT TROPONIN I

## 2011-10-12 MED ORDER — POTASSIUM CHLORIDE CRYS ER 20 MEQ PO TBCR
40.0000 meq | EXTENDED_RELEASE_TABLET | Freq: Once | ORAL | Status: AC
  Administered 2011-10-12: 40 meq via ORAL
  Filled 2011-10-12: qty 2

## 2011-10-12 MED ORDER — SODIUM CHLORIDE 0.9 % IV SOLN
Freq: Once | INTRAVENOUS | Status: AC
  Administered 2011-10-12: 20 mL/h via INTRAVENOUS

## 2011-10-12 MED ORDER — ONDANSETRON HCL 4 MG/2ML IJ SOLN
4.0000 mg | Freq: Once | INTRAMUSCULAR | Status: AC
  Administered 2011-10-12: 4 mg via INTRAVENOUS

## 2011-10-12 MED ORDER — PANTOPRAZOLE SODIUM 20 MG PO TBEC: 40.0000 mg | DELAYED_RELEASE_TABLET | Freq: Every day | ORAL | Status: DC

## 2011-10-12 MED ORDER — ONDANSETRON HCL 4 MG/2ML IJ SOLN
INTRAMUSCULAR | Status: AC
  Administered 2011-10-12: 4 mg via INTRAVENOUS
  Filled 2011-10-12: qty 2

## 2011-10-12 MED ORDER — POTASSIUM CHLORIDE 10 MEQ/100ML IV SOLN
10.0000 meq | Freq: Once | INTRAVENOUS | Status: AC
  Administered 2011-10-12: 10 meq via INTRAVENOUS
  Filled 2011-10-12: qty 100

## 2011-10-12 MED ORDER — POTASSIUM CHLORIDE CRYS ER 20 MEQ PO TBCR: 40.0000 meq | EXTENDED_RELEASE_TABLET | Freq: Once | ORAL | Status: DC

## 2011-10-12 NOTE — ED Notes (Signed)
Pt to ED via [facility], c/o sudden onset of mid-sternum chest pain approx 1115 this am, assoc. W/sob, non-radiating, denies N/V or dizziness, asa 324 mg and Nitro 0.4 mg sl x1 given en route, 12 lead non remarkable

## 2011-10-12 NOTE — ED Notes (Signed)
Patient transported to X-ray 

## 2011-10-12 NOTE — ED Notes (Signed)
Pt c/o sudden onset of non radiating mid-sternum chest pain, SOB, nausea, and dizziness starting 1115 this am. Pt reports hx of the same last year and had a full cardiac work up in Oct 2012. Pt describes the pain as a dull aching pain, rates pain 4/10 after receiving Nitro 0.4mg  SL x1  en route by EMS. Pt reports her initial pain was a 8/10 and described her pain as a sharp pain

## 2011-10-12 NOTE — ED Notes (Signed)
Pt is in a gown and on a monitor  

## 2011-10-12 NOTE — ED Provider Notes (Addendum)
History     CSN: [id number]  Arrival date & time 10/12/11  1221   First MD Initiated Contact with Patient 10/12/11 1303      Chief Complaint  Patient presents with  . Chest Pain    (Consider location/radiation/quality/duration/timing/severity/associated sxs/prior treatment) Patient is a 36 y.o. female presenting with chest pain. The history is provided by the patient.  Chest Pain The chest pain began 1 - 2 hours ago. Chest pain occurs frequently. The chest pain is improving. The severity of the pain is moderate. The quality of the pain is described as squeezing. The pain does not radiate. Primary symptoms include shortness of breath and nausea. Pertinent negatives for primary symptoms include no fever, no fatigue, no syncope, no cough, no wheezing, no palpitations, no abdominal pain, no vomiting, no dizziness and no altered mental status.  Pertinent negatives for associated symptoms include no numbness and no weakness. She tried nitroglycerin for the symptoms. Risk factors include obesity.   Pt with hx of prediabetes, HTN and obesity presents with chest pain which awoke her from sleep this morning around 1115. States she had eaten a sandwich around 0830 then lay back down to take a nap and was awakened by the pain. It was located in the midline and did not radiate, described as squeezing sensation. No known aggravating/alleviating factors. Has never had anything like this before. She had slight dyspnea but denies diaphoresis. Denies nausea, vomiting. She was given 324 mg of ASA and 1 NTG en route which relieved her pain. Denies hx of CAD but states she had a stress test at [facility] about a year ago which showed "thick heart muscle" and she was started on lisinopril daily.  Past Medical History  Diagnosis Date  . Obesity, Class III, BMI 40-49.9 (morbid obesity)   . Prediabetes     dx several years ago    Past Surgical History  Procedure Date  . Tubal ligation 2001    Family History    Problem Relation Age of Onset  . Cancer Other   . COPD Other   . Heart disease Other   . Hyperlipidemia Other   . Stroke Other   . Obesity Other     History  Substance Use Topics  . Smoking status: Never Smoker   . Smokeless tobacco: Not on file  . Alcohol Use: No    OB History    Grav Para Term Preterm Abortions TAB SAB Ect Mult Living                  Review of Systems  Constitutional: Negative for fever, chills, activity change, appetite change and fatigue.  HENT: Negative.   Respiratory: Positive for shortness of breath. Negative for cough and wheezing.   Cardiovascular: Positive for chest pain. Negative for palpitations and syncope.  Gastrointestinal: Positive for nausea. Negative for vomiting and abdominal pain.  Musculoskeletal: Negative for myalgias.  Skin: Negative for rash.  Neurological: Negative for dizziness, weakness and numbness.  Psychiatric/Behavioral: Negative for altered mental status.    Allergies  Review of patient's allergies indicates no known allergies.  Home Medications   Current Outpatient Rx  Name Route Sig Dispense Refill  . LISINOPRIL 10 MG PO TABS Oral Take 10 mg by mouth daily.    Marland Kitchen METFORMIN HCL ER 500 MG PO TB24 Oral Take 1,000 mg by mouth at bedtime.        BP 142/71  Pulse 78  Temp(Src) 98.4 F (36.9 C) (Oral)  Resp 20  SpO2 96%  Physical Exam  Nursing note and vitals reviewed. Constitutional: She is oriented to person, place, and time. She appears well-developed and well-nourished. No distress.  HENT:  Head: Normocephalic and atraumatic.  Mouth/Throat: Oropharynx is clear and moist.  Eyes: EOM are normal. Pupils are equal, round, and reactive to light.  Neck: Normal range of motion. Neck supple.  Cardiovascular: Normal rate and regular rhythm.  Exam reveals no gallop and no friction rub.   No murmur heard. Pulmonary/Chest: Effort normal and breath sounds normal. She exhibits no tenderness.  Abdominal: Soft. Bowel  sounds are normal. There is no tenderness. There is no rebound and no guarding.  Musculoskeletal: Normal range of motion.  Neurological: She is alert and oriented to person, place, and time.  Skin: Skin is warm and dry. No rash noted. She is not diaphoretic.  Psychiatric: She has a normal mood and affect.    ED Course  Procedures (including critical care time)   Date: 10/12/2011  Rate: 73  Rhythm: normal sinus rhythm  QRS Axis: normal  Intervals: normal  ST/T Wave abnormalities: borderline T wave abnormalities (flat T waves)  Conduction Disutrbances:none  Narrative Interpretation:  Old EKG Reviewed: compared with April 2010, no significant changes  Labs Reviewed  CBC - Abnormal; Notable for the following:    RBC 5.17 (*)    All other components within normal limits  BASIC METABOLIC PANEL - Abnormal; Notable for the following:    Potassium 2.6 (*)    Glucose, Bld 181 (*)    BUN 5 (*)    All other components within normal limits  POTASSIUM - Abnormal; Notable for the following:    Potassium 3.4 (*) DELTA CHECK NOTED   All other components within normal limits  POCT I-STAT TROPONIN I  POCT PREGNANCY, URINE  TROPONIN I  LAB REPORT - SCANNED   Dg Chest 2 View  10/12/2011  *RADIOLOGY REPORT*  Clinical Data: Chest pain, shortness of breath  CHEST - 2 VIEW  Comparison: 12/31/2008  Findings: Cardiomediastinal silhouette is within normal limits. The lungs are clear. No pleural effusion.  No pneumothorax.  No acute osseous abnormality.  IMPRESSION: Normal chest.  Original Report Authenticated By: Harrel Lemon, M.D.     1. Hypokalemia   2. Chest pain       MDM  1:21 PM Pt with obesity and pre-diabetes presents with CP which was relieved with NTG en route. She has received asa as well. No known CAD. ECG with no evidence for STEMI. Plan to obtain labs, CXR. Will monitor closely.  4:03 PM Pt's pain has resolved. She was hypokalemic on labs to 2.6 but labs otherwise  unremarkable. Oral repletion ordered. Will recheck K and get second troponin. Anticipate discharge to home.  Patient's second potassium was satisfactory. Second troponin negative. Plan to dc pt home with rx for Protonix to tx for possible reflux like sx. Pt encouraged to make f/u with PCP this week for a recheck. Return precautions discussed.   Grant Fontana, [place] 10/13/11 0931  Grant Fontana, PA 11/05/11 1655

## 2011-10-13 NOTE — ED Provider Notes (Signed)
Medical screening examination/treatment/procedure(s) were performed by non-physician practitioner and as supervising physician I was immediately available for consultation/collaboration.  Nicholes Stairs, MD 10/13/11 1455

## 2011-11-10 NOTE — ED Provider Notes (Signed)
Medical screening examination/treatment/procedure(s) were performed by non-physician practitioner and as supervising physician I was immediately available for consultation/collaboration.  Elinore Shults P Casi Westerfeld, MD 11/10/11 1501 

## 2011-12-27 ENCOUNTER — Encounter (HOSPITAL_BASED_OUTPATIENT_CLINIC_OR_DEPARTMENT_OTHER): Payer: Self-pay | Admitting: *Deleted

## 2011-12-27 ENCOUNTER — Emergency Department (HOSPITAL_BASED_OUTPATIENT_CLINIC_OR_DEPARTMENT_OTHER)
Admission: EM | Admit: 2011-12-27 | Discharge: 2011-12-27 | Disposition: A | Discharge status: Home / Self Care | Payer: BC Managed Care – PPO | Attending: Emergency Medicine | Admitting: Emergency Medicine

## 2011-12-27 DIAGNOSIS — K219 Gastro-esophageal reflux disease without esophagitis: Secondary | ICD-10-CM | POA: Insufficient documentation

## 2011-12-27 DIAGNOSIS — R7309 Other abnormal glucose: Secondary | ICD-10-CM | POA: Insufficient documentation

## 2011-12-27 DIAGNOSIS — N898 Other specified noninflammatory disorders of vagina: Secondary | ICD-10-CM | POA: Insufficient documentation

## 2011-12-27 DIAGNOSIS — N939 Abnormal uterine and vaginal bleeding, unspecified: Secondary | ICD-10-CM

## 2011-12-27 HISTORY — DX: Gastro-esophageal reflux disease without esophagitis: K21.9

## 2011-12-27 LAB — CBC
HCT: 37.2 % (ref 36.0–46.0)
MCV: 81.2 fL (ref 78.0–100.0)
RBC: 4.58 MIL/uL (ref 3.87–5.11)
RDW: 12.8 % (ref 11.5–15.5)
WBC: 5.7 10*3/uL (ref 4.0–10.5)

## 2011-12-27 LAB — DIFFERENTIAL
Basophils Absolute: 0 10*3/uL (ref 0.0–0.1)
Lymphocytes Relative: 43 % (ref 12–46)
Lymphs Abs: 2.4 10*3/uL (ref 0.7–4.0)
Monocytes Absolute: 0.5 10*3/uL (ref 0.1–1.0)
Neutro Abs: 2.7 10*3/uL (ref 1.7–7.7)

## 2011-12-27 NOTE — ED Notes (Signed)
Patient states that she started vaginal bleeding on the #rd and noticed clots on the 7th, still bleeding, leg cramps, dizzy at times

## 2011-12-27 NOTE — Discharge Instructions (Signed)
Abnormal Vaginal Bleeding Abnormal vaginal bleeding means bleeding from the vagina that is not your normal menstrual period. Bleeding may be heavy or light. It may last for days or come and go. There are many problems that may cause this. HOME CARE  Keep track of your periods on a calendar if they are not regular.   Write down:   How often pads or tampons are changed.   The size and number of clots, if there are any.   A change in the color of the blood.   A change in the amount of blood.   Any smell.   The time and strength of cramps or pain.   Limit activity as told.   Eat a healthy diet.   Do not have sex (intercourse) until your doctor says it is okay.   Never have unprotected sex unless you are trying to get pregnant.   Only take medicine as told by your doctor.  GET HELP RIGHT AWAY IF:   You get dizzy or feel faint when standing up.   You have to change pads or tampons more than once an hour.   You develop abdominal pain.   You start bleeding heavily.   You develop a fever or other concerns. MAKE SURE YOU:  Understand these instructions.   Will watch your condition.   Will get help right away if you are not doing well or get worse.  Document Released: 06/22/2009 Document Revised: 08/14/2011 Document Reviewed: 06/22/2009 [facility] Patient Information 2012 [place], [place].

## 2011-12-27 NOTE — ED Provider Notes (Signed)
History     CSN: [id number]  Arrival date & time 12/27/11  0902   First MD Initiated Contact with Patient 12/27/11 0920      Chief Complaint  Patient presents with  . Vaginal Bleeding    (Consider location/radiation/quality/duration/timing/severity/associated sxs/prior treatment) HPI This 36 year old female has about 2 weeks of vaginal bleeding after not having a period for about 4 months. She has history of irregular menses and was placed on metformin about a year ago to help with her periods. She states the metformin helps for several months and she didn't have any menses for the last few months. Over the last 2 weeks she feels slightly lightheaded at times briefly but is not lightheaded standing up in the emergency room now and has no fever chest pain shortness of breath abdominal pain vaginal discharge dysuria or other concerns. There is no treatment prior to arrival. Past Medical History  Diagnosis Date  . Obesity, Class III, BMI 40-49.9 (morbid obesity)   . Prediabetes     dx several years ago  . Acid reflux     History reviewed. No pertinent past surgical history.  Family History  Problem Relation Age of Onset  . Cancer Other   . COPD Other   . Heart disease Other   . Hyperlipidemia Other   . Stroke Other   . Obesity Other     History  Substance Use Topics  . Smoking status: Never Smoker   . Smokeless tobacco: Not on file  . Alcohol Use: No    OB History    Grav Para Term Preterm Abortions TAB SAB Ect Mult Living                  Review of Systems  Constitutional: Negative for fever.       10 Systems reviewed and are negative for acute change except as noted in the HPI.  HENT: Negative for congestion.   Eyes: Negative for discharge and redness.  Respiratory: Negative for cough and shortness of breath.   Cardiovascular: Negative for chest pain.  Gastrointestinal: Negative for vomiting and abdominal pain.  Genitourinary: Positive for vaginal bleeding.  Negative for dysuria and vaginal discharge.  Musculoskeletal: Negative for back pain.  Skin: Negative for rash.  Neurological: Negative for syncope, numbness and headaches.  Psychiatric/Behavioral:       No behavior change.    Allergies  Review of patient's allergies indicates no known allergies.  Home Medications   Current Outpatient Rx  Name Route Sig Dispense Refill  . LISINOPRIL 10 MG PO TABS Oral Take 10 mg by mouth daily.    Marland Kitchen METFORMIN HCL ER 500 MG PO TB24 Oral Take 1,000 mg by mouth at bedtime.      Marland Kitchen PANTOPRAZOLE SODIUM 20 MG PO TBEC Oral Take 2 tablets (40 mg total) by mouth daily. 60 tablet 0    BP 125/93  Pulse 88  Temp(Src) 98.1 F (36.7 C) (Oral)  Resp 18  SpO2 100%  Physical Exam  Nursing note and vitals reviewed. Constitutional:       Awake, alert, nontoxic appearance.  HENT:  Head: Atraumatic.  Eyes: Right eye exhibits no discharge. Left eye exhibits no discharge.  Neck: Neck supple.  Cardiovascular: Normal rate and regular rhythm.   No murmur heard. Pulmonary/Chest: Effort normal and breath sounds normal. No respiratory distress. She has no wheezes. She has no rales. She exhibits no tenderness.  Abdominal: Soft. Bowel sounds are normal. She exhibits no mass. There is  no tenderness. There is no rebound and no guarding.  Genitourinary:       Chaperone was present for a pelvic examination cervix closed, dark blood in the vaginal vault present, no vaginal discharge, no cervical motion tenderness or adnexal tenderness or adnexal masses palpated  Musculoskeletal: She exhibits no tenderness.       Baseline ROM, no obvious new focal weakness.  Neurological: She is alert.       Mental status and motor strength appears baseline for patient and situation. Normal speech and gait.  Skin: No rash noted.  Psychiatric: She has a normal mood and affect.    ED Course  Procedures (including critical care time)   Labs Reviewed  CBC  DIFFERENTIAL  PREGNANCY,  URINE   No results found.   1. Abnormal vaginal bleeding       MDM   Patient / Family / Caregiver understand and agree with initial ED impression and plan with expectations set for ED visit.  Pt stable in ED with no significant deterioration in condition.  Patient / Family / Caregiver informed of clinical course, understand medical decision-making process, and agree with plan.  I doubt any other EMC precluding discharge at this time including, but not necessarily limited to the following:SBI, severe anemia.      Hurman Horn, MD 12/29/11 1438

## 2012-01-05 ENCOUNTER — Encounter: Payer: Self-pay | Admitting: Obstetrics and Gynecology

## 2012-01-14 ENCOUNTER — Encounter: Payer: Self-pay | Admitting: Obstetrics and Gynecology

## 2012-01-30 ENCOUNTER — Encounter: Payer: Self-pay | Admitting: Obstetrics and Gynecology

## 2012-03-31 ENCOUNTER — Emergency Department (HOSPITAL_COMMUNITY): Payer: BC Managed Care – PPO

## 2012-03-31 ENCOUNTER — Ambulatory Visit (HOSPITAL_COMMUNITY)
Admission: EM | Admit: 2012-03-31 | Discharge: 2012-03-31 | Disposition: A | Discharge status: Home / Self Care | Payer: BC Managed Care – PPO | Attending: Obstetrics and Gynecology | Admitting: Obstetrics and Gynecology

## 2012-03-31 ENCOUNTER — Encounter (HOSPITAL_COMMUNITY): Payer: Self-pay | Admitting: *Deleted

## 2012-03-31 DIAGNOSIS — D649 Anemia, unspecified: Secondary | ICD-10-CM

## 2012-03-31 DIAGNOSIS — N949 Unspecified condition associated with female genital organs and menstrual cycle: Secondary | ICD-10-CM | POA: Insufficient documentation

## 2012-03-31 DIAGNOSIS — D500 Iron deficiency anemia secondary to blood loss (chronic): Secondary | ICD-10-CM | POA: Insufficient documentation

## 2012-03-31 DIAGNOSIS — R42 Dizziness and giddiness: Secondary | ICD-10-CM | POA: Insufficient documentation

## 2012-03-31 DIAGNOSIS — R0602 Shortness of breath: Secondary | ICD-10-CM | POA: Insufficient documentation

## 2012-03-31 DIAGNOSIS — N938 Other specified abnormal uterine and vaginal bleeding: Secondary | ICD-10-CM | POA: Insufficient documentation

## 2012-03-31 LAB — HCG, SERUM, QUALITATIVE: Preg, Serum: NEGATIVE

## 2012-03-31 LAB — CBC
Hemoglobin: 6.8 g/dL — CL (ref 12.0–15.0)
MCH: 21.9 pg — ABNORMAL LOW (ref 26.0–34.0)
MCV: 72.3 fL — ABNORMAL LOW (ref 78.0–100.0)
Platelets: 257 10*3/uL (ref 150–400)
RBC: 3.11 MIL/uL — ABNORMAL LOW (ref 3.87–5.11)

## 2012-03-31 LAB — ABO/RH: ABO/RH(D): O POS

## 2012-03-31 LAB — PREPARE RBC (CROSSMATCH)

## 2012-03-31 MED ORDER — DIPHENHYDRAMINE HCL 25 MG PO CAPS
25.0000 mg | ORAL_CAPSULE | Freq: Once | ORAL | Status: AC
  Administered 2012-03-31: 25 mg via ORAL
  Filled 2012-03-31: qty 1

## 2012-03-31 MED ORDER — ACETAMINOPHEN 325 MG PO TABS
650.0000 mg | ORAL_TABLET | Freq: Once | ORAL | Status: AC
  Administered 2012-03-31: 650 mg via ORAL
  Filled 2012-03-31: qty 2

## 2012-03-31 MED ORDER — SODIUM CHLORIDE 0.9 % IV BOLUS (SEPSIS)
1000.0000 mL | Freq: Once | INTRAVENOUS | Status: AC
  Administered 2012-03-31: 1000 mL via INTRAVENOUS

## 2012-03-31 MED ORDER — SODIUM CHLORIDE 0.9 % IV SOLN: INTRAVENOUS | Status: DC

## 2012-03-31 NOTE — ED Notes (Signed)
Report given to [facility] transport.over phone

## 2012-03-31 NOTE — H&P (Signed)
Lynn Pearson is an 36 y.o. female. Sent from the Ed secondary to SOB and dizziness.  Pt has been bleeding for one month.  She has had irregular periods since the age of 45.  It does regulate when she takes metformin.  She has not taken it in a month  Pertinent Gynecological History: Menses: flow is excessive with use of 12 pads or tampons on heaviest days Bleeding: dysfunctional uterine bleeding Contraception: tubal ligation DES exposure: unknown Blood transfusions: yes in 2001 with an ectopic pregnancy Sexually transmitted diseases: no past history Previous GYN Procedures: L/S left salpingectiomy  Last mammogram: na Date: na Last pap: normal Date: pt unsure OB History: G3, P2012   Menstrual History: Menarche age: 12 Patient's last menstrual period was 02/26/2012.    Past Medical History  Diagnosis Date  . Obesity, Class III, BMI 40-49.9 (morbid obesity)   . Prediabetes     dx several years ago  . Acid reflux   . Diabetes mellitus     History reviewed. No pertinent past surgical history.  Family History  Problem Relation Age of Onset  . Cancer Other   . COPD Other   . Heart disease Other   . Hyperlipidemia Other   . Stroke Other   . Obesity Other     Social History:  reports that she has never smoked. She does not have any smokeless tobacco history on file. She reports that she does not drink alcohol. Her drug history not on file.  Allergies: No Known Allergies  Prescriptions prior to admission  Medication Sig Dispense Refill  . lisinopril (PRINIVIL,ZESTRIL) 10 MG tablet Take 10 mg by mouth daily.      . metFORMIN (GLUCOPHAGE-XR) 500 MG 24 hr tablet Take 1,000 mg by mouth at bedtime.        . pantoprazole (PROTONIX) 20 MG tablet Take 2 tablets (40 mg total) by mouth daily.  60 tablet  0    ROS  Blood pressure 114/53, pulse 84, temperature 97.6 F (36.4 C), temperature source Oral, resp. rate 20, last menstrual period 02/26/2012, SpO2 99.00%. Physical  Exam Physical Examination: General appearance - alert, well appearing, and in no distress Physical Examination: Chest - clear to auscultation, no wheezes, rales or rhonchi, symmetric air entry Heart - normal rate and regular rhythm Abdomen - soft, nontender, nondistended, no masses or organomegaly Pelvic - moderate vaginal bleeding uterus top normal size. No adnexal masses or tenderness Extremities  no pedal edema noted    Results for orders placed during the hospital encounter of 03/31/12 (from the past 24 hour(s))  CBC     Status: Abnormal   Collection Time   03/31/12  2:45 AM      Component Value Range   WBC 8.2  4.0 - 10.5 K/uL   RBC 3.11 (*) 3.87 - 5.11 MIL/uL   Hemoglobin 6.8 (*) 12.0 - 15.0 g/dL   HCT 11.9 (*) 14.7 - 82.9 %   MCV 72.3 (*) 78.0 - 100.0 fL   MCH 21.9 (*) 26.0 - 34.0 pg   MCHC 30.2  30.0 - 36.0 g/dL   RDW 56.2  13.0 - 86.5 %   Platelets 257  150 - 400 K/uL  TYPE AND SCREEN     Status: Normal   Collection Time   03/31/12  4:00 AM      Component Value Range   ABO/RH(D) O POS     Antibody Screen NEG     Sample Expiration 04/03/2012  ABO/RH     Status: Normal   Collection Time   03/31/12  4:00 AM      Component Value Range   ABO/RH(D) O POS    HCG, SERUM, QUALITATIVE     Status: Normal   Collection Time   03/31/12  4:02 AM      Component Value Range   Preg, Serum NEGATIVE  NEGATIVE    US Transvaginal Non-ob  03/31/2012  *RADIOLOGY REPORT*  Clinical Data: Vaginal bleeding.  TRANSABDOMINAL AND TRANSVAGINAL ULTRASOUND OF PELVIS Technique:  Both transabdominal and transvaginal ultrasound examinations of the pelvis were performed. Transabdominal technique was performed for global imaging of the pelvis including uterus, ovaries, adnexal regions, and pelvic cul-de-sac.  It was necessary to proceed with endovaginal exam following the transabdominal exam to visualize the endometrium.  Comparison:  08/17/2003  Findings:  Uterus: Measures 10.9 x 5.9 x 6.0 cm.  Fundus  is somewhat obscured due to uterine orientation.  Endometrium: Measures up to 1.5 cm, with a somewhat focal 1.0 x 0.9 x 0.6 cm echogenic structure along the endometrium.  Right ovary:  Not visualized  Left ovary: Not visualized  Other findings: No free fluid  IMPRESSION:  1.  Echogenic structure along the endometrium is somewhat focal and could represent polyp or submucosal fibroid. Blood products could have a similar appearance.  This could be further characterized with sonohysterography or pelvic MRI, if clinically warranted. 2.  Nonvisualization of either ovary.  The fundus of the uterus was also somewhat obscured due to artifact.  Original Report Authenticated By: Dellia Cloud, M.D.   US Pelvis Complete  03/31/2012  *RADIOLOGY REPORT*  Clinical Data: Vaginal bleeding.  TRANSABDOMINAL AND TRANSVAGINAL ULTRASOUND OF PELVIS Technique:  Both transabdominal and transvaginal ultrasound examinations of the pelvis were performed. Transabdominal technique was performed for global imaging of the pelvis including uterus, ovaries, adnexal regions, and pelvic cul-de-sac.  It was necessary to proceed with endovaginal exam following the transabdominal exam to visualize the endometrium.  Comparison:  08/17/2003  Findings:  Uterus: Measures 10.9 x 5.9 x 6.0 cm.  Fundus is somewhat obscured due to uterine orientation.  Endometrium: Measures up to 1.5 cm, with a somewhat focal 1.0 x 0.9 x 0.6 cm echogenic structure along the endometrium.  Right ovary:  Not visualized  Left ovary: Not visualized  Other findings: No free fluid  IMPRESSION:  1.  Echogenic structure along the endometrium is somewhat focal and could represent polyp or submucosal fibroid. Blood products could have a similar appearance.  This could be further characterized with sonohysterography or pelvic MRI, if clinically warranted. 2.  Nonvisualization of either ovary.  The fundus of the uterus was also somewhat obscured due to artifact.  Original Report  Authenticated By: Dellia Cloud, M.D.    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 I spoke with DR Dareen Piano and he will accept her care I recommend [facility] as an out patient  Mirtha Jain A 03/31/2012, 8:29 AM

## 2012-03-31 NOTE — ED Notes (Signed)
Patient transported to Ultrasound 

## 2012-03-31 NOTE — ED Notes (Addendum)
Pt reports having vaginal bleeding time 1 month beginning to feel weak and dizzy denies injury or event

## 2012-03-31 NOTE — MAU Note (Signed)
Pt transferred over from [facility] via [facility]. Presented there with vaginal bleeding x 1 1/2 months , pt states this is not unusual for her, that usually she will go about 3 months without a period and then bleed for a month. This time pt states she started feeling short of breath with activity.

## 2012-03-31 NOTE — ED Provider Notes (Addendum)
History     CSN: [id number]  Arrival date & time 03/31/12  0158   First MD Initiated Contact with Patient 03/31/12 0224      Chief Complaint  Patient presents with  . Vaginal Bleeding    (Consider location/radiation/quality/duration/timing/severity/associated sxs/prior treatment) Patient is a 36 y.o. female presenting with vaginal bleeding. The history is provided by the patient.  Vaginal Bleeding This is a chronic problem. The current episode started more than 1 week ago. The problem occurs constantly. The problem has not changed since onset.Associated symptoms include shortness of breath. Pertinent negatives include no chest pain. The symptoms are aggravated by walking. Nothing relieves the symptoms. She has tried nothing for the symptoms.    Past Medical History  Diagnosis Date  . Obesity, Class III, BMI 40-49.9 (morbid obesity)   . Prediabetes     dx several years ago  . Acid reflux     No past surgical history on file.  Family History  Problem Relation Age of Onset  . Cancer Other   . COPD Other   . Heart disease Other   . Hyperlipidemia Other   . Stroke Other   . Obesity Other     History  Substance Use Topics  . Smoking status: Never Smoker   . Smokeless tobacco: Not on file  . Alcohol Use: No    OB History    Grav Para Term Preterm Abortions TAB SAB Ect Mult Living                  Review of Systems  Constitutional: Positive for fatigue.  Respiratory: Positive for shortness of breath.   Cardiovascular: Negative for chest pain.  Genitourinary: Positive for vaginal bleeding.  All other systems reviewed and are negative.    Allergies  Review of patient's allergies indicates no known allergies.  Home Medications   Current Outpatient Rx  Name Route Sig Dispense Refill  . LISINOPRIL 10 MG PO TABS Oral Take 10 mg by mouth daily.    Marland Kitchen METFORMIN HCL ER 500 MG PO TB24 Oral Take 1,000 mg by mouth at bedtime.      Marland Kitchen PANTOPRAZOLE SODIUM 20 MG PO TBEC  Oral Take 2 tablets (40 mg total) by mouth daily. 60 tablet 0    BP 138/82  Pulse 91  Temp 98.3 F (36.8 C) (Oral)  Resp 16  SpO2 100%  Physical Exam  Constitutional: She is oriented to person, place, and time. She appears well-developed and well-nourished.  HENT:  Head: Normocephalic and atraumatic.  Eyes: Conjunctivae and EOM are normal. Pupils are equal, round, and reactive to light.  Neck: Normal range of motion.  Cardiovascular: Normal rate, regular rhythm and normal heart sounds.   Pulmonary/Chest: Effort normal and breath sounds normal.  Abdominal: Soft. Bowel sounds are normal.  Musculoskeletal: Normal range of motion.  Neurological: She is alert and oriented to person, place, and time.  Skin: Skin is warm and dry.  Psychiatric: She has a normal mood and affect. Her behavior is normal.    ED Course  Procedures (including critical care time)  Labs Reviewed - No data to display No results found.   No diagnosis found.    MDM  + dysfunctional bleeding x 1 mnth.  Will cbc,  [place],  Preg,  Reassess.  + symptomatic anemia, discussed with dillard, requesting transfer to womens      Rosanne Ashing, MD 03/31/12 0242  Zadie Deemer Lytle Michaels, MD 03/31/12 [phone]

## 2012-04-01 LAB — TYPE AND SCREEN
Antibody Screen: NEGATIVE
Unit division: 0

## 2012-04-02 ENCOUNTER — Inpatient Hospital Stay (HOSPITAL_COMMUNITY): Admission: RE | Admit: 2012-04-02 | Payer: BC Managed Care – PPO | Source: Ambulatory Visit

## 2012-04-05 ENCOUNTER — Other Ambulatory Visit: Payer: Self-pay | Admitting: Obstetrics and Gynecology

## 2012-04-07 ENCOUNTER — Encounter (HOSPITAL_COMMUNITY): Payer: Self-pay

## 2012-04-07 ENCOUNTER — Encounter (HOSPITAL_COMMUNITY)
Admission: RE | Admit: 2012-04-07 | Discharge: 2012-04-07 | Disposition: A | Discharge status: Home / Self Care | Payer: BC Managed Care – PPO | Source: Ambulatory Visit | Attending: Obstetrics and Gynecology | Admitting: Obstetrics and Gynecology

## 2012-04-07 HISTORY — DX: Other chest pain: R07.89

## 2012-04-07 LAB — BASIC METABOLIC PANEL
CO2: 28 mEq/L (ref 19–32)
Calcium: 8.5 mg/dL (ref 8.4–10.5)
Chloride: 101 mEq/L (ref 96–112)
GFR calc Af Amer: >90 mL/min (ref >90)
Sodium: 137 mEq/L (ref 135–145)

## 2012-04-07 LAB — CBC
Platelets: 214 10*3/uL (ref 150–400)
RBC: 3.46 MIL/uL — ABNORMAL LOW (ref 3.87–5.11)
WBC: 6.3 10*3/uL (ref 4.0–10.5)

## 2012-04-07 NOTE — Pre-Procedure Instructions (Signed)
Hgb of 8.0 noted and not reported to anesthesia because protocol is to notify if < 8.0 and pt recently had blood transfusion for 2 units and Dr. Dareen Piano aware of low hgb.

## 2012-04-07 NOTE — Patient Instructions (Addendum)
        Your procedure is scheduled on:04/12/12 Enter through the Main Entrance at :1030 am Pick up desk phone and dial [phone] and inform us of your arrival.  Please call [phone] if you have any problems the morning of surgery.  Remember: Do not eat after midnight:Sunday Do not drink after: 8am on Monday  Take these meds the morning of surgery with a sip of water:Lisinopril HOLD METFORMIN FOR 24 HOURS PRIOR TO SURGERY  DO NOT wear jewelry, eye make-up, lipstick,body lotion, or dark fingernail polish. Do not shave for 48 hours prior to surgery.  If you are to be admitted after surgery, leave suitcase in car until your room has been assigned. Patients discharged on the day of surgery will not be allowed to drive home.   Remember to use your Hibiclens as instructed.

## 2012-04-11 MED ORDER — DEXTROSE 5 % IV SOLN
3.0000 g | INTRAVENOUS | Status: AC
  Administered 2012-04-12: 3 g via INTRAVENOUS
  Filled 2012-04-11: qty 3000

## 2012-04-12 ENCOUNTER — Ambulatory Visit (HOSPITAL_COMMUNITY): Payer: BC Managed Care – PPO | Admitting: Anesthesiology

## 2012-04-12 ENCOUNTER — Encounter (HOSPITAL_COMMUNITY)
Admission: RE | Disposition: A | Discharge status: Home / Self Care | Payer: Self-pay | Source: Ambulatory Visit | Attending: Obstetrics and Gynecology

## 2012-04-12 ENCOUNTER — Encounter (HOSPITAL_COMMUNITY): Payer: Self-pay | Admitting: *Deleted

## 2012-04-12 ENCOUNTER — Encounter (HOSPITAL_COMMUNITY): Payer: Self-pay | Admitting: Anesthesiology

## 2012-04-12 ENCOUNTER — Ambulatory Visit (HOSPITAL_COMMUNITY)
Admission: RE | Admit: 2012-04-12 | Discharge: 2012-04-12 | Disposition: A | Discharge status: Home / Self Care | Payer: BC Managed Care – PPO | Source: Ambulatory Visit | Attending: Obstetrics and Gynecology | Admitting: Obstetrics and Gynecology

## 2012-04-12 DIAGNOSIS — N840 Polyp of corpus uteri: Secondary | ICD-10-CM | POA: Insufficient documentation

## 2012-04-12 DIAGNOSIS — D649 Anemia, unspecified: Secondary | ICD-10-CM | POA: Insufficient documentation

## 2012-04-12 DIAGNOSIS — Z01818 Encounter for other preprocedural examination: Secondary | ICD-10-CM | POA: Insufficient documentation

## 2012-04-12 DIAGNOSIS — Z01812 Encounter for preprocedural laboratory examination: Secondary | ICD-10-CM | POA: Insufficient documentation

## 2012-04-12 DIAGNOSIS — N920 Excessive and frequent menstruation with regular cycle: Secondary | ICD-10-CM

## 2012-04-12 HISTORY — PX: HYSTEROSCOPY W/D&C: SHX1775

## 2012-04-12 LAB — CBC
HCT: 28.1 % — ABNORMAL LOW (ref 36.0–46.0)
Hemoglobin: 8.6 g/dL — ABNORMAL LOW (ref 12.0–15.0)
MCHC: 30.6 g/dL (ref 30.0–36.0)
WBC: 12 10*3/uL — ABNORMAL HIGH (ref 4.0–10.5)

## 2012-04-12 LAB — PREGNANCY, URINE: Preg Test, Ur: NEGATIVE

## 2012-04-12 SURGERY — DILATATION AND CURETTAGE /HYSTEROSCOPY
Anesthesia: General | Site: Uterus | Wound class: Clean Contaminated

## 2012-04-12 MED ORDER — LIDOCAINE HCL 1 % IJ SOLN
INTRAMUSCULAR | Status: DC | PRN
  Administered 2012-04-12: 28 mL

## 2012-04-12 MED ORDER — METOCLOPRAMIDE HCL 5 MG/ML IJ SOLN
INTRAMUSCULAR | Status: AC
  Filled 2012-04-12: qty 2

## 2012-04-12 MED ORDER — KETOROLAC TROMETHAMINE 30 MG/ML IJ SOLN
INTRAMUSCULAR | Status: DC | PRN
  Administered 2012-04-12: 30 mg via INTRAVENOUS

## 2012-04-12 MED ORDER — PROPOFOL 10 MG/ML IV EMUL
INTRAVENOUS | Status: DC | PRN
  Administered 2012-04-12: 180 mg via INTRAVENOUS

## 2012-04-12 MED ORDER — FENTANYL CITRATE 0.05 MG/ML IJ SOLN
INTRAMUSCULAR | Status: AC
  Filled 2012-04-12: qty 2

## 2012-04-12 MED ORDER — MIDAZOLAM HCL 2 MG/2ML IJ SOLN
INTRAMUSCULAR | Status: AC
  Filled 2012-04-12: qty 2

## 2012-04-12 MED ORDER — FENTANYL CITRATE 0.05 MG/ML IJ SOLN: 25.0000 ug | INTRAMUSCULAR | Status: DC | PRN

## 2012-04-12 MED ORDER — LACTATED RINGERS IV SOLN
INTRAVENOUS | Status: DC
  Administered 2012-04-12: 11:00:00 via INTRAVENOUS

## 2012-04-12 MED ORDER — ONDANSETRON HCL 4 MG/2ML IJ SOLN
INTRAMUSCULAR | Status: DC | PRN
  Administered 2012-04-12: 4 mg via INTRAVENOUS

## 2012-04-12 MED ORDER — FENTANYL CITRATE 0.05 MG/ML IJ SOLN
INTRAMUSCULAR | Status: DC | PRN
  Administered 2012-04-12: 50 ug via INTRAVENOUS

## 2012-04-12 MED ORDER — LIDOCAINE HCL (CARDIAC) 20 MG/ML IV SOLN
INTRAVENOUS | Status: AC
  Filled 2012-04-12: qty 5

## 2012-04-12 MED ORDER — METOCLOPRAMIDE HCL 5 MG/ML IJ SOLN
10.0000 mg | Freq: Once | INTRAMUSCULAR | Status: AC | PRN
  Administered 2012-04-12: 10 mg via INTRAVENOUS

## 2012-04-12 MED ORDER — MEPERIDINE HCL 25 MG/ML IJ SOLN: 6.2500 mg | INTRAMUSCULAR | Status: DC | PRN

## 2012-04-12 MED ORDER — GLYCINE 1.5 % IR SOLN
Status: DC | PRN
  Administered 2012-04-12: 3000 mL

## 2012-04-12 MED ORDER — PROPOFOL 10 MG/ML IV EMUL
INTRAVENOUS | Status: AC
  Filled 2012-04-12: qty 20

## 2012-04-12 MED ORDER — LIDOCAINE HCL (CARDIAC) 20 MG/ML IV SOLN
INTRAVENOUS | Status: DC | PRN
  Administered 2012-04-12 (×2): 30 mg via INTRAVENOUS

## 2012-04-12 MED ORDER — ONDANSETRON HCL 4 MG/2ML IJ SOLN
INTRAMUSCULAR | Status: AC
  Filled 2012-04-12: qty 2

## 2012-04-12 MED ORDER — KETOROLAC TROMETHAMINE 30 MG/ML IJ SOLN
INTRAMUSCULAR | Status: AC
  Filled 2012-04-12: qty 1

## 2012-04-12 MED ORDER — MIDAZOLAM HCL 5 MG/5ML IJ SOLN
INTRAMUSCULAR | Status: DC | PRN
  Administered 2012-04-12: 2 mg via INTRAVENOUS

## 2012-04-12 SURGICAL SUPPLY — 18 items
CANISTER SUCTION 2500CC (MISCELLANEOUS) ×2 IMPLANT
CATH ROBINSON RED A/P 16FR (CATHETERS) ×2 IMPLANT
CLOTH BEACON ORANGE TIMEOUT ST (SAFETY) ×2 IMPLANT
CONTAINER PREFILL 10% NBF 60ML (FORM) ×4 IMPLANT
ELECT REM PT RETURN 9FT ADLT (ELECTROSURGICAL) ×2
ELECTRODE REM PT RTRN 9FT ADLT (ELECTROSURGICAL) ×1 IMPLANT
GLOVE BIOGEL PI IND STRL 7.0 (GLOVE) ×1 IMPLANT
GLOVE BIOGEL PI INDICATOR 7.0 (GLOVE) ×1
GLOVE ECLIPSE 6.5 STRL STRAW (GLOVE) ×2 IMPLANT
GLOVE ECLIPSE 7.0 STRL STRAW (GLOVE) ×4 IMPLANT
GOWN PREVENTION PLUS LG XLONG (DISPOSABLE) ×2 IMPLANT
GOWN PREVENTION PLUS XLARGE (GOWN DISPOSABLE) ×2 IMPLANT
GOWN STRL REIN XL XLG (GOWN DISPOSABLE) ×2 IMPLANT
LOOP ANGLED CUTTING 22FR (CUTTING LOOP) IMPLANT
PACK HYSTEROSCOPY LF (CUSTOM PROCEDURE TRAY) ×2 IMPLANT
PAD OB MATERNITY 4.3X12.25 (PERSONAL CARE ITEMS) ×2 IMPLANT
TOWEL OR 17X24 6PK STRL BLUE (TOWEL DISPOSABLE) ×4 IMPLANT
WATER STERILE IRR 1000ML POUR (IV SOLUTION) ×2 IMPLANT

## 2012-04-12 NOTE — Transfer of Care (Signed)
Immediate Anesthesia Transfer of Care Note  Patient: Lynn Pearson  Procedure(s) Performed: Procedure(s) (LRB): DILATATION AND CURETTAGE /HYSTEROSCOPY (N/A)  Patient Location: PACU  Anesthesia Type: General  Level of Consciousness: awake and alert   Airway & Oxygen Therapy: Patient Spontanous Breathing and Patient connected to nasal cannula oxygen  Post-op Assessment: Report given to PACU RN  Post vital signs: Reviewed and stable  Complications: No apparent anesthesia complications

## 2012-04-12 NOTE — Anesthesia Postprocedure Evaluation (Signed)
Anesthesia Post Note  Patient: Lynn Pearson  Procedure(s) Performed: Procedure(s) (LRB): DILATATION AND CURETTAGE /HYSTEROSCOPY (N/A)  Anesthesia type: General  Patient location: PACU  Post pain: Pain level controlled  Post assessment: Post-op Vital signs reviewed  Last Vitals:  Filed Vitals:   04/12/12 1300  BP: 118/67  Pulse: 78  Temp:   Resp: 18    Post vital signs: Reviewed  Level of consciousness: sedated  Complications: No apparent anesthesia complicationsfj

## 2012-04-12 NOTE — H&P (Signed)
Pt is a 36 year old black female, [id number], who presents for hysteroscopy, D&C secondary to menorrhagia and anemia. Pt was admitted to [facility] in July and transfused with PRBCs. An Ultrasound revealed that she may have a polyp or submucous fibroid. Pt has a FMHx of Uterine cancer. PE VSSAF Abd- no masses non tender Pelvic- ext-wnl             Vag-wnl              cx-parous             Ut - not palp IMP/ menorrhagia         Possible polyp Plan/ Hysteroscopy           D&C

## 2012-04-12 NOTE — Anesthesia Preprocedure Evaluation (Addendum)
Anesthesia Evaluation  Patient identified by MRN, date of birth, ID band Patient awake    Reviewed: Allergy & Precautions, H&P , NPO status , Patient's Chart, lab work & pertinent test results  Airway Mallampati: III TM Distance: >3 FB Neck ROM: full    Dental No notable dental hx. (+) Teeth Intact   Pulmonary neg pulmonary ROS,  breath sounds clear to auscultation  Pulmonary exam normal       Cardiovascular negative cardio ROS  Rhythm:regular Rate:Normal     Neuro/Psych negative neurological ROS  negative psych ROS   GI/Hepatic Neg liver ROS, GERD-  Medicated and Controlled,  Endo/Other  negative endocrine ROSWell ControlledMorbid obesity  Renal/GU negative Renal ROS     Musculoskeletal   Abdominal Normal abdominal exam  (+)   Peds  Hematology negative hematology ROS (+)   Anesthesia Other Findings   Reproductive/Obstetrics negative OB ROS PCOS                          Anesthesia Physical Anesthesia Plan  ASA: III  Anesthesia Plan: General LMA   Post-op Pain Management:    Induction:   Airway Management Planned:   Additional Equipment:   Intra-op Plan:   Post-operative Plan:   Informed Consent: I have reviewed the patients History and Physical, chart, labs and discussed the procedure including the risks, benefits and alternatives for the proposed anesthesia with the patient or authorized representative who has indicated his/her understanding and acceptance.   Dental Advisory Given  Plan Discussed with: Anesthesiologist, CRNA and Surgeon  Anesthesia Plan Comments:         Anesthesia Quick Evaluation

## 2012-04-13 ENCOUNTER — Encounter (HOSPITAL_COMMUNITY): Payer: Self-pay | Admitting: Obstetrics and Gynecology

## 2012-04-13 NOTE — Op Note (Signed)
NAMEKENIYA, Lynn Pearson NO.:  [id number]  MEDICAL RECORD NO.:  [id number]  LOCATION:  [facility]                          FACILITY:  [facility]  PHYSICIAN:  Malva Limes, M.D.    DATE OF BIRTH:  Mar 15, 1976  DATE OF PROCEDURE:  04/12/2012 DATE OF DISCHARGE:  04/12/2012                              OPERATIVE REPORT   PREOPERATIVE DIAGNOSES: 1. Menorrhagia. 2. Possible endometrial polyp.  POSTOPERATIVE DIAGNOSES: 1. Menorrhagia. 2. Possible endometrial polyp with small endometrial polyp.  PROCEDURE: 1. Hysteroscopy. 2. Dilation and curettage.  SURGEON:  Malva Limes, M.D.  ANESTHESIA:  General with paracervical block.  ANTIBIOTICS:  Ancef 2 g.  DRAINS:  Red rubber catheter bladder.  SPECIMENS:  Endometrial curettings sent to Pathology.  COMPLICATIONS:  None.  ESTIMATED BLOOD LOSS:  25 mL.  PROCEDURE:  The patient was taken to the operating room, where general anesthetic was administered without difficulty.  She was then placed in dorsal lithotomy position.  She was prepped and draped in the usual fashion for this procedure.  Her bladder was drained with a red rubber catheter.  A sterile speculum was placed in the vagina.  18 mL of 1% lidocaine was used for paracervical block.  The cervix was dilated to a 27-French.  The hysteroscope was advanced through the endocervical canal, which appeared to be normal.  On entering the uterine cavity, both ostia were visualized without difficulty.  There was no evidence of a submucous fibroid.  The patient did have a small endometrial polyp on the right posterior wall.  At this point, the hysteroscope was removed. Sharp curettage was then performed.  Copious amount of tissue was obtained.  The hysteroscope was placed back in the uterine cavity.  It appeared that the polyp was removed.  This concluded the procedure.  The patient was awoken and taken to the recovery room in stable condition.  Instrument, lap count was correct  x2.  The patient will be discharged to home.  She will be sent home with Vicodin to take p.r.n.  She will follow up in the office in 4 weeks.          ______________________________ Malva Limes, M.D.     MA/MEDQ  D:  04/12/2012  T:  04/13/2012  Job:  [id number]

## 2012-05-18 ENCOUNTER — Other Ambulatory Visit: Payer: Self-pay | Admitting: Obstetrics and Gynecology

## 2012-05-30 ENCOUNTER — Emergency Department (HOSPITAL_COMMUNITY): Payer: BC Managed Care – PPO

## 2012-05-30 ENCOUNTER — Emergency Department (HOSPITAL_COMMUNITY)
Admission: EM | Admit: 2012-05-30 | Discharge: 2012-05-30 | Disposition: A | Discharge status: Home / Self Care | Payer: BC Managed Care – PPO | Attending: Emergency Medicine | Admitting: Emergency Medicine

## 2012-05-30 ENCOUNTER — Encounter (HOSPITAL_COMMUNITY): Payer: Self-pay | Admitting: *Deleted

## 2012-05-30 DIAGNOSIS — Z79899 Other long term (current) drug therapy: Secondary | ICD-10-CM | POA: Insufficient documentation

## 2012-05-30 DIAGNOSIS — R10819 Abdominal tenderness, unspecified site: Secondary | ICD-10-CM | POA: Insufficient documentation

## 2012-05-30 DIAGNOSIS — R1031 Right lower quadrant pain: Secondary | ICD-10-CM | POA: Insufficient documentation

## 2012-05-30 DIAGNOSIS — M549 Dorsalgia, unspecified: Secondary | ICD-10-CM | POA: Insufficient documentation

## 2012-05-30 DIAGNOSIS — N83209 Unspecified ovarian cyst, unspecified side: Secondary | ICD-10-CM | POA: Insufficient documentation

## 2012-05-30 LAB — CBC WITH DIFFERENTIAL/PLATELET
Basophils Absolute: 0 10*3/uL (ref 0.0–0.1)
Basophils Relative: 0 % (ref 0–1)
Eosinophils Absolute: 0.1 10*3/uL (ref 0.0–0.7)
Eosinophils Relative: 1 % (ref 0–5)
HCT: 30.9 % — ABNORMAL LOW (ref 36.0–46.0)
Hemoglobin: 9.1 g/dL — ABNORMAL LOW (ref 12.0–15.0)
Lymphocytes Relative: 27 % (ref 12–46)
Lymphs Abs: 2.5 10*3/uL (ref 0.7–4.0)
MCH: 19.2 pg — ABNORMAL LOW (ref 26.0–34.0)
MCHC: 29.4 g/dL — ABNORMAL LOW (ref 30.0–36.0)
MCV: 65.1 fL — ABNORMAL LOW (ref 78.0–100.0)
Monocytes Absolute: 0.6 10*3/uL (ref 0.1–1.0)
Monocytes Relative: 6 % (ref 3–12)
Neutro Abs: 6 10*3/uL (ref 1.7–7.7)
Neutrophils Relative %: 66 % (ref 43–77)
Platelets: 312 10*3/uL (ref 150–400)
RBC: 4.75 MIL/uL (ref 3.87–5.11)
RDW: 17.7 % — ABNORMAL HIGH (ref 11.5–15.5)
WBC: 9.2 10*3/uL (ref 4.0–10.5)

## 2012-05-30 LAB — COMPREHENSIVE METABOLIC PANEL
ALT: 14 U/L (ref 0–35)
AST: 18 U/L (ref 0–37)
Albumin: 3.5 g/dL (ref 3.5–5.2)
Alkaline Phosphatase: 64 U/L (ref 39–117)
BUN: 8 mg/dL (ref 6–23)
CO2: 27 mEq/L (ref 19–32)
Calcium: 9.3 mg/dL (ref 8.4–10.5)
Chloride: 99 mEq/L (ref 96–112)
Creatinine, Ser: 0.71 mg/dL (ref 0.50–1.10)
GFR calc Af Amer: >90 mL/min (ref >90)
GFR calc non Af Amer: >90 mL/min (ref >90)
Glucose, Bld: 114 mg/dL — ABNORMAL HIGH (ref 70–99)
Potassium: 3.5 mEq/L (ref 3.5–5.1)
Sodium: 136 mEq/L (ref 135–145)
Total Bilirubin: 0.2 mg/dL — ABNORMAL LOW (ref 0.3–1.2)
Total Protein: 7.3 g/dL (ref 6.0–8.3)

## 2012-05-30 LAB — PREGNANCY, URINE: Preg Test, Ur: NEGATIVE

## 2012-05-30 LAB — URINALYSIS, ROUTINE W REFLEX MICROSCOPIC
Ketones, ur: NEGATIVE mg/dL
Nitrite: NEGATIVE
Protein, ur: NEGATIVE mg/dL
Urobilinogen, UA: 0.2 mg/dL (ref 0.0–1.0)

## 2012-05-30 LAB — LIPASE, BLOOD: Lipase: 57 U/L (ref 11–59)

## 2012-05-30 MED ORDER — IOHEXOL 300 MG/ML  SOLN
100.0000 mL | Freq: Once | INTRAMUSCULAR | Status: AC | PRN
  Administered 2012-05-30: 100 mL via INTRAVENOUS

## 2012-05-30 MED ORDER — ONDANSETRON HCL 4 MG/2ML IJ SOLN
4.0000 mg | Freq: Once | INTRAMUSCULAR | Status: AC
  Administered 2012-05-30: 4 mg via INTRAVENOUS
  Filled 2012-05-30: qty 2

## 2012-05-30 MED ORDER — MORPHINE SULFATE 4 MG/ML IJ SOLN
6.0000 mg | Freq: Once | INTRAMUSCULAR | Status: AC
  Administered 2012-05-30: 6 mg via INTRAVENOUS
  Filled 2012-05-30: qty 2

## 2012-05-30 MED ORDER — HYDROCODONE-ACETAMINOPHEN 5-325 MG PO TABS: 1.0000 | ORAL_TABLET | Freq: Four times a day (QID) | ORAL | Status: DC | PRN

## 2012-05-30 MED ORDER — SODIUM CHLORIDE 0.9 % IV BOLUS (SEPSIS)
1000.0000 mL | Freq: Once | INTRAVENOUS | Status: AC
  Administered 2012-05-30: 1000 mL via INTRAVENOUS

## 2012-05-30 MED ORDER — IBUPROFEN 800 MG PO TABS: 800.0000 mg | ORAL_TABLET | Freq: Three times a day (TID) | ORAL | Status: DC | PRN

## 2012-05-30 NOTE — ED Notes (Signed)
US tech at pt bedside.

## 2012-05-30 NOTE — ED Provider Notes (Signed)
History     CSN: [id number]  Arrival date & time 05/30/12  2130   First MD Initiated Contact with Patient 05/30/12 [phone]      Chief Complaint  Patient presents with  . Abdominal Pain    (Consider location/radiation/quality/duration/timing/severity/associated sxs/prior treatment) HPI Patient presents emergency department with abdominal pain, that began yesterday. patient, states she's had mild nausea, but no vomiting.  Patient denies chest pain, shortness breath, back pain, weakness, numbness, fever, headache, dysuria, vaginal bleeding, vaginal discharge or dizziness.  Patient, states, that she did not take anything prior to arrival, for her discomfort.      Past Medical History  Diagnosis Date  . Obesity, Class III, BMI 40-49.9 (morbid obesity)   . Prediabetes     dx several years ago  . Acid reflux   . Chest pain, atypical     negative ER reports in EPIC    Past Surgical History  Procedure Date  . Diagnostic laparoscopy   . Hysteroscopy w/d&c 04/12/2012    Procedure: DILATATION AND CURETTAGE /HYSTEROSCOPY;  Surgeon: Levi Aland, MD;  Location: [facility];  Service: Gynecology;  Laterality: N/A;    Family History  Problem Relation Age of Onset  . Cancer Other   . COPD Other   . Heart disease Other   . Hyperlipidemia Other   . Stroke Other   . Obesity Other     History  Substance Use Topics  . Smoking status: Never Smoker   . Smokeless tobacco: Not on file  . Alcohol Use: No    OB History    Grav Para Term Preterm Abortions TAB SAB Ect Mult Living   8 4 4  4 1 2 1         Review of Systems All other systems negative except as documented in the HPI. All pertinent positives and negatives as reviewed in the HPI.  Allergies  Review of patient's allergies indicates no known allergies.  Home Medications   Current Outpatient Rx  Name Route Sig Dispense Refill  . HYDROCHLOROTHIAZIDE 12.5 MG PO CAPS Oral Take 12.5 mg by mouth daily.    Marland Kitchen LISINOPRIL 10 MG PO  TABS Oral Take 10 mg by mouth daily.    Marland Kitchen METFORMIN HCL 500 MG PO TABS Oral Take 500 mg by mouth 2 (two) times daily.      BP 155/68  Pulse 78  Temp 98.9 F (37.2 C) (Oral)  Resp 16  Ht 5\' 8"  (1.727 m)  Wt 280 lb (127.007 kg)  BMI 42.57 kg/m2  SpO2 98%  LMP 04/12/2012  Physical Exam  Nursing note and vitals reviewed. Constitutional: She is oriented to person, place, and time. She appears well-developed and well-nourished. No distress.  HENT:  Head: Normocephalic and atraumatic.  Mouth/Throat: Oropharynx is clear and moist.  Eyes: Pupils are equal, round, and reactive to light.  Cardiovascular: Normal rate, regular rhythm and normal heart sounds.  Exam reveals no gallop and no friction rub.   No murmur heard. Pulmonary/Chest: Effort normal and breath sounds normal. No respiratory distress.  Abdominal: Normal appearance and bowel sounds are normal. There is tenderness in the right lower quadrant, periumbilical area, left upper quadrant and left lower quadrant. There is no rigidity, no rebound, no guarding, no tenderness at McBurney's point and negative Murphy's sign.    Neurological: She is alert and oriented to person, place, and time.  Skin: Skin is warm and dry.    ED Course  Procedures (including critical care time)  Labs Reviewed  URINALYSIS, ROUTINE W REFLEX MICROSCOPIC - Abnormal; Notable for the following:    Leukocytes, UA TRACE (*)     All other components within normal limits  URINE MICROSCOPIC-ADD ON - Abnormal; Notable for the following:    Squamous Epithelial / LPF FEW (*)     All other components within normal limits  CBC WITH DIFFERENTIAL - Abnormal; Notable for the following:    Hemoglobin 9.1 (*)     HCT 30.9 (*)     MCV 65.1 (*)     MCH 19.2 (*)     MCHC 29.4 (*)     RDW 17.7 (*)     All other components within normal limits  COMPREHENSIVE METABOLIC PANEL - Abnormal; Notable for the following:    Glucose, Bld 114 (*)     Total Bilirubin 0.2 (*)       All other components within normal limits  LIPASE, BLOOD  PREGNANCY, URINE   Ct Abdomen Pelvis W Contrast  05/30/2012  *RADIOLOGY REPORT*  Clinical Data: Right lower quadrant and back pain.  Negative urinary pregnancy test.  CT ABDOMEN AND PELVIS WITH CONTRAST  Technique:  Multidetector CT imaging of the abdomen and pelvis was performed following the standard protocol during bolus administration of intravenous contrast.  Contrast: OMNIPAQUE IOHEXOL 300 MG/ML  SOLN  Comparison: None.  Findings: The heart is normal in size and the lung bases are clear.  Date the area of hypoattenuation in the left lobe of the liver adjacent to the falciform ligament is likely focal fatty infiltration.  The liver is otherwise unremarkable.  Normal spleen, gallbladder and pancreas.  No bile duct dilation.  No adrenal masses.  There are 2 tiny nonobstructing stones suggested in the right kidney.  There is an area of parenchymal scarring along the posterior mid pole of the left kidney.  Kidneys are otherwise unremarkable.  No hydronephrosis.  Normal ureters and bladder.  The uterus is unremarkable.  There are bilateral ovarian cysts, larger on the right.  Overall, the right ovary is enlarged measuring 7.6 cm x 5.4 cm x 4.1 cm.  The left ovary is normal in overall size.  No adenopathy.  No abnormal fluid collections.  Normal bowel.  A normal appendix is not visualized.  There are degenerative changes of the visualized spine most prominent at L5-S1.  No osteoblastic or osteolytic lesions.  IMPRESSION: Two small renal calculi are suggested in the right kidney, but there are no ureteral stones or signs of obstruction.  Normal appendix.  Bilateral ovarian cysts, larger on the right, causing mild overall enlargement of the right ovary.  There is no free fluid, however, to suggest a ruptured ovarian cyst.  Still, if this correlates clinically with the site of pain, this could be further evaluated with pelvic ultrasound.   Degenerative changes of the visualized spine.  Exam otherwise unremarkable.   Original Report Authenticated By: Domenic Moras, M.D.      Patient has a right ovarian cyst, that may explain her pain.  Patient be referred to GYN for further evaluation.  Patient has been stable here in the emergency department with her pain control for home.  MDM  MDM Reviewed: vitals and nursing note Interpretation: labs, ultrasound and CT scan            Carlyle Dolly, PA-C 05/30/12 1234

## 2012-05-30 NOTE — ED Notes (Signed)
WUX:[id number]<WN> Expected date:<BR> Expected time:<BR> Means of arrival:<BR> Comments:<BR> Rm 15, PTAR 22, 34 F, Abd Pain

## 2012-05-30 NOTE — ED Notes (Signed)
Attempted to give medications to patient, but pt not in room. Will reassess.

## 2012-05-30 NOTE — ED Notes (Signed)
As per EMS pt c/o pain RLQ radiating to back.VSS.pwd

## 2012-05-30 NOTE — ED Notes (Signed)
Pt asked for something else for pain.  PA made aware.

## 2012-05-30 NOTE — ED Notes (Signed)
Pt taken to CT.

## 2012-05-30 NOTE — ED Provider Notes (Signed)
Medical screening examination/treatment/procedure(s) were performed by non-physician practitioner and as supervising physician I was immediately available for consultation/collaboration.  Yeriel Mineo T Bexley Mclester, MD 05/30/12 1501 

## 2012-05-30 NOTE — ED Notes (Signed)
[place] called and will be coming to patient room for [place]

## 2012-07-06 ENCOUNTER — Observation Stay (HOSPITAL_COMMUNITY): Payer: BC Managed Care – PPO

## 2012-07-06 ENCOUNTER — Observation Stay (HOSPITAL_BASED_OUTPATIENT_CLINIC_OR_DEPARTMENT_OTHER)
Admission: EM | Admit: 2012-07-06 | Discharge: 2012-07-07 | Disposition: A | Discharge status: Home / Self Care | Payer: BC Managed Care – PPO | Attending: Internal Medicine | Admitting: Internal Medicine

## 2012-07-06 ENCOUNTER — Encounter (HOSPITAL_BASED_OUTPATIENT_CLINIC_OR_DEPARTMENT_OTHER): Payer: Self-pay | Admitting: *Deleted

## 2012-07-06 DIAGNOSIS — Z23 Encounter for immunization: Secondary | ICD-10-CM | POA: Insufficient documentation

## 2012-07-06 DIAGNOSIS — E669 Obesity, unspecified: Secondary | ICD-10-CM

## 2012-07-06 DIAGNOSIS — R5383 Other fatigue: Secondary | ICD-10-CM | POA: Insufficient documentation

## 2012-07-06 DIAGNOSIS — R5381 Other malaise: Secondary | ICD-10-CM | POA: Insufficient documentation

## 2012-07-06 DIAGNOSIS — D649 Anemia, unspecified: Secondary | ICD-10-CM

## 2012-07-06 DIAGNOSIS — N920 Excessive and frequent menstruation with regular cycle: Secondary | ICD-10-CM | POA: Insufficient documentation

## 2012-07-06 DIAGNOSIS — D500 Iron deficiency anemia secondary to blood loss (chronic): Principal | ICD-10-CM

## 2012-07-06 DIAGNOSIS — R0609 Other forms of dyspnea: Secondary | ICD-10-CM | POA: Insufficient documentation

## 2012-07-06 DIAGNOSIS — R7309 Other abnormal glucose: Secondary | ICD-10-CM | POA: Insufficient documentation

## 2012-07-06 DIAGNOSIS — R7303 Prediabetes: Secondary | ICD-10-CM

## 2012-07-06 DIAGNOSIS — R42 Dizziness and giddiness: Secondary | ICD-10-CM | POA: Insufficient documentation

## 2012-07-06 DIAGNOSIS — R079 Chest pain, unspecified: Secondary | ICD-10-CM | POA: Insufficient documentation

## 2012-07-06 DIAGNOSIS — R0989 Other specified symptoms and signs involving the circulatory and respiratory systems: Secondary | ICD-10-CM | POA: Insufficient documentation

## 2012-07-06 DIAGNOSIS — I10 Essential (primary) hypertension: Secondary | ICD-10-CM

## 2012-07-06 HISTORY — DX: Essential (primary) hypertension: I10

## 2012-07-06 HISTORY — DX: Encounter for other specified aftercare: Z51.89

## 2012-07-06 HISTORY — DX: Polycystic ovarian syndrome: E28.2

## 2012-07-06 LAB — FOLATE: Folate: >20 ng/mL

## 2012-07-06 LAB — PREPARE RBC (CROSSMATCH)

## 2012-07-06 LAB — IRON AND TIBC
Iron: 17 ug/dL — ABNORMAL LOW (ref 42–135)
Saturation Ratios: 3 % — ABNORMAL LOW (ref 20–55)
TIBC: 509 ug/dL — ABNORMAL HIGH (ref 250–470)
UIBC: 492 ug/dL — ABNORMAL HIGH (ref 125–400)

## 2012-07-06 LAB — CBC WITH DIFFERENTIAL/PLATELET
Eosinophils Absolute: 0.1 10*3/uL (ref 0.0–0.7)
MCH: 18.5 pg — ABNORMAL LOW (ref 26.0–34.0)
MCHC: 28.8 g/dL — ABNORMAL LOW (ref 30.0–36.0)
Monocytes Absolute: 0.7 10*3/uL (ref 0.1–1.0)
Neutrophils Relative %: 50 % (ref 43–77)
Platelets: 308 10*3/uL (ref 150–400)
RBC: 3.57 MIL/uL — ABNORMAL LOW (ref 3.87–5.11)

## 2012-07-06 LAB — COMPREHENSIVE METABOLIC PANEL
Albumin: 3.6 g/dL (ref 3.5–5.2)
BUN: 7 mg/dL (ref 6–23)
Creatinine, Ser: 0.8 mg/dL (ref 0.50–1.10)
Total Protein: 7.5 g/dL (ref 6.0–8.3)

## 2012-07-06 LAB — FERRITIN: Ferritin: <1 ng/mL — ABNORMAL LOW (ref 10–291)

## 2012-07-06 LAB — TROPONIN I: Troponin I: <0.3 ng/mL (ref <0.30)

## 2012-07-06 MED ORDER — MEDROXYPROGESTERONE ACETATE 5 MG PO TABS
5.0000 mg | ORAL_TABLET | Freq: Every day | ORAL | Status: DC
  Administered 2012-07-06 – 2012-07-07 (×2): 5 mg via ORAL
  Filled 2012-07-06 (×3): qty 1

## 2012-07-06 MED ORDER — LISINOPRIL 10 MG PO TABS
10.0000 mg | ORAL_TABLET | Freq: Every day | ORAL | Status: DC
Start: 2012-07-06 — End: 2012-07-08
  Administered 2012-07-06 – 2012-07-07 (×2): 10 mg via ORAL
  Filled 2012-07-06 (×2): qty 1

## 2012-07-06 MED ORDER — METFORMIN HCL 500 MG PO TABS
500.0000 mg | ORAL_TABLET | Freq: Two times a day (BID) | ORAL | Status: DC
  Administered 2012-07-06 – 2012-07-07 (×3): 500 mg via ORAL
  Filled 2012-07-06 (×3): qty 1

## 2012-07-06 MED ORDER — NORGESTREL-ETHINYL ESTRADIOL 0.3-30 MG-MCG PO TABS: 1.0000 | ORAL_TABLET | Freq: Every day | ORAL | Status: DC

## 2012-07-06 MED ORDER — HYDROCODONE-ACETAMINOPHEN 5-325 MG PO TABS: 1.0000 | ORAL_TABLET | Freq: Four times a day (QID) | ORAL | Status: DC | PRN

## 2012-07-06 MED ORDER — FERROUS SULFATE 325 (65 FE) MG PO TABS
325.0000 mg | ORAL_TABLET | Freq: Three times a day (TID) | ORAL | Status: DC
  Administered 2012-07-06 – 2012-07-07 (×5): 325 mg via ORAL
  Filled 2012-07-06 (×7): qty 1

## 2012-07-06 NOTE — H&P (Signed)
[facility]          History and Physical    PCP: Primus Bravo  OB/Gyn: Malva Limes, MD   Chief Complaint:  Gardiner Fanti on exertion  HPI: Lynn Pearson is a 36/F with h/o iron deficiency anemia, Heavy menstrual cycles, PCOD, HTN, pre-DM presented [facility] [place] with Dyspnea on exertion, weakness and dizziness since Saturday. She underwent Hysteroscopy and D&C in Aug/2013 for menorrhagia and possible endometrial polyp, was then referred to an Endocrinologist and Hematology. After that she had a menstrual period 2 and 1/2 weeks ago, which lasted till yesterday and only subsided after she started BCP after she called her Gynecologist. Her menstrual bleeding subsided yesterday. Denies any other overt blood loss, has been taking OTC iron Qdaily In ER noted to have Hb of 6.6g/dl, down from 4.0-9W/JX,  2 months ago    Allergies:  No Known Allergies    Past Medical History  Diagnosis Date  . Obesity, Class III, BMI 40-49.9 (morbid obesity)   . Prediabetes     dx several years ago  . Acid reflux   . Chest pain, atypical     negative ER reports in EPIC  . Blood transfusion without reported diagnosis   . Hypertension   . Polycystic ovarian disease     Past Surgical History  Procedure Date  . Diagnostic laparoscopy   . Hysteroscopy w/d&c 04/12/2012    Procedure: DILATATION AND CURETTAGE /HYSTEROSCOPY;  Surgeon: Levi Aland, MD;  Location: [facility];  Service: Gynecology;  Laterality: N/A;  . Ectopic pregnancy surgery     Prior to Admission medications   Medication Sig Start Date End Date Taking? Authorizing Provider  ferrous sulfate 325 (65 FE) MG tablet Take 325 mg by mouth daily with breakfast.   Yes Historical Provider, MD  hydrochlorothiazide (MICROZIDE) 12.5 MG capsule Take 12.5 mg by mouth daily.   Yes Historical Provider, MD  HYDROcodone-acetaminophen (NORCO/VICODIN) 5-325 MG per tablet Take 1 tablet by mouth every 6 (six) hours as needed for  pain. 05/30/12  Yes Jamesetta Orleans Lawyer, PA-C  ibuprofen (ADVIL,MOTRIN) 800 MG tablet Take 1 tablet (800 mg total) by mouth every 8 (eight) hours as needed for pain. 05/30/12  Yes Jamesetta Orleans Lawyer, PA-C  lisinopril (PRINIVIL,ZESTRIL) 10 MG tablet Take 10 mg by mouth daily.   Yes Historical Provider, MD  medroxyPROGESTERone (PROVERA) 5 MG tablet Take 5 mg by mouth daily.   Yes Historical Provider, MD  metFORMIN (GLUCOPHAGE) 500 MG tablet Take 500 mg by mouth 2 (two) times daily.   Yes Historical Provider, MD  norgestrel-ethinyl estradiol (CRYSELLE-28) 0.3-30 MG-MCG tablet Take 1 tablet by mouth daily.   Yes Historical Provider, MD    Social History: married, lives at home with husband and kids, denies ETOH or smoking  reports that she has never smoked. She has never used smokeless tobacco. She reports that she does not drink alcohol or use illicit drugs.  Family History  Problem Relation Age of Onset  . Cancer Other   . COPD Other   . Heart disease Other   . Hyperlipidemia Other   . Stroke Other   . Obesity Other   h/o cervical cancer in mother   Review of Systems:  Constitutional: Denies fever, chills, diaphoresis, appetite change and fatigue.  HEENT: Denies photophobia, eye pain, redness, hearing loss, ear pain, congestion, sore throat, rhinorrhea, sneezing, mouth sores, trouble swallowing, neck pain, neck stiffness and tinnitus.   Respiratory: Denies SOB, DOE, cough, chest tightness,  and wheezing.   Cardiovascular: Denies chest pain, palpitations and leg swelling.  Gastrointestinal: Denies nausea, vomiting, abdominal pain, diarrhea, constipation, blood in stool and abdominal distention.  Genitourinary: Denies dysuria, urgency, frequency, hematuria, flank pain and difficulty urinating.  Musculoskeletal: Denies myalgias, back pain, joint swelling, arthralgias and gait problem.  Skin: Denies pallor, rash and wound.  Neurological: Denies dizziness, seizures, syncope, weakness,  light-headedness, numbness and headaches.  Hematological: Denies adenopathy. Easy bruising, personal or family bleeding history  Psychiatric/Behavioral: Denies suicidal ideation, mood changes, confusion, nervousness, sleep disturbance and agitation   Physical Exam: Blood pressure 154/83, pulse 83, temperature 98.6 F (37 C), temperature source Oral, resp. rate 18, height 5\' 7"  (1.702 m), weight 131.226 kg (289 lb 4.8 oz), last menstrual period 06/14/2012, SpO2 100.00%. GEn: AAOx3, obesity HEENT: PERRLA, pallor noted CVS: S1S2/RRR, no m/r/g Lungs: CTAB Abd: soft,NT, obese, BS present, nor organomegaly Ext: no edema/c/c NEuro: moves all extremities, no focal signs Skin: no rashes or skin braekdown   Labs on Admission:  Results for orders placed during the hospital encounter of 07/06/12 (from the past 48 hour(s))  CBC WITH DIFFERENTIAL     Status: Abnormal   Collection Time   07/06/12  3:21 AM      Component Value Range Comment   WBC 7.3  4.0 - 10.5 K/uL    RBC 3.57 (*) 3.87 - 5.11 MIL/uL    Hemoglobin 6.6 (*) 12.0 - 15.0 g/dL    HCT 09.8 (*) 11.9 - 46.0 %    MCV 64.1 (*) 78.0 - 100.0 fL    MCH 18.5 (*) 26.0 - 34.0 pg    MCHC 28.8 (*) 30.0 - 36.0 g/dL    RDW 14.7 (*) 82.9 - 15.5 %    Platelets 308  150 - 400 K/uL    Neutrophils Relative 50  43 - 77 %    Lymphocytes Relative 38  12 - 46 %    Monocytes Relative 10  3 - 12 %    Eosinophils Relative 2  0 - 5 %    Basophils Relative 0  0 - 1 %    Neutro Abs 3.7  1.7 - 7.7 K/uL    Lymphs Abs 2.8  0.7 - 4.0 K/uL    Monocytes Absolute 0.7  0.1 - 1.0 K/uL    Eosinophils Absolute 0.1  0.0 - 0.7 K/uL    Basophils Absolute 0.0  0.0 - 0.1 K/uL    RBC Morphology POLYCHROMASIA PRESENT      WBC Morphology ATYPICAL LYMPHOCYTES   WHITE COUNT CONFIRMED ON SMEAR   Smear Review PLATELET COUNT CONFIRMED BY SMEAR     COMPREHENSIVE METABOLIC PANEL     Status: Abnormal   Collection Time   07/06/12  3:21 AM      Component Value Range Comment    Sodium 140  135 - 145 mEq/L    Potassium 3.2 (*) 3.5 - 5.1 mEq/L    Chloride 101  96 - 112 mEq/L    CO2 27  19 - 32 mEq/L    Glucose, Bld 130 (*) 70 - 99 mg/dL    BUN 7  6 - 23 mg/dL    Creatinine, Ser 5.62  0.50 - 1.10 mg/dL    Calcium 8.7  8.4 - 13.0 mg/dL    Total Protein 7.5  6.0 - 8.3 g/dL    Albumin 3.6  3.5 - 5.2 g/dL    AST 15  0 - 37 U/L    ALT 11  0 - 35 U/L    Alkaline Phosphatase 50  39 - 117 U/L    Total Bilirubin 0.2 (*) 0.3 - 1.2 mg/dL    GFR calc non Af Amer >90  >90 mL/min    GFR calc Af Amer >90  >90 mL/min   TROPONIN I     Status: Normal   Collection Time   07/06/12  4:43 AM      Component Value Range Comment   Troponin I <0.30  <0.30 ng/mL   VITAMIN B12     Status: Normal   Collection Time   07/06/12  5:17 AM      Component Value Range Comment   Vitamin B-12 405  211 - 911 pg/mL   FOLATE     Status: Normal   Collection Time   07/06/12  5:17 AM      Component Value Range Comment   Folate >20.0     IRON AND TIBC     Status: Abnormal   Collection Time   07/06/12  5:17 AM      Component Value Range Comment   Iron 17 (*) 42 - 135 ug/dL    TIBC 960 (*) 454 - 098 ug/dL    Saturation Ratios 3 (*) 20 - 55 %    UIBC 492 (*) 125 - 400 ug/dL   FERRITIN     Status: Abnormal   Collection Time   07/06/12  5:17 AM      Component Value Range Comment   Ferritin <1 (*) 10 - 291 ng/mL   RETICULOCYTES     Status: Abnormal   Collection Time   07/06/12  5:17 AM      Component Value Range Comment   Retic Ct Pct 2.2  0.4 - 3.1 %    RBC. 3.72 (*) 3.87 - 5.11 MIL/uL    Retic Count, Manual 81.8  19.0 - 186.0 K/uL   TYPE AND SCREEN     Status: Normal (Preliminary result)   Collection Time   07/06/12 11:12 AM      Component Value Range Comment   ABO/RH(D) O POS      Antibody Screen NEG      Sample Expiration 07/09/2012      Unit Number [id number]      Blood Component Type RED CELLS,LR      Unit division 00      Status of Unit ISSUED      Transfusion Status OK TO  TRANSFUSE      Crossmatch Result Compatible      Unit Number [id number]      Blood Component Type RED CELLS,LR      Unit division 00      Status of Unit ALLOCATED      Transfusion Status OK TO TRANSFUSE      Crossmatch Result Compatible      Unit Number [id number]      Blood Component Type RED CELLS,LR      Unit division 00      Status of Unit ALLOCATED      Transfusion Status OK TO TRANSFUSE      Crossmatch Result Compatible     PREPARE RBC (CROSSMATCH)     Status: Normal   Collection Time   07/06/12 11:12 AM      Component Value Range Comment   Order Confirmation ORDER PROCESSED BY BLOOD BANK       Radiological Exams on Admission: No results found.  Assessment/Plan  1. Acute on Chronic Iron  Deficiency anemia Symptomatic anemia at this point Admit as obs Transfuse 3 units PRBC Menstrual bleeding subsided yesterday Continue BCP per Ob/Gyn Fu with OB for [facility]/PCOD and Hematology, referral already made, will benefit from Iron infusions Increase supplemental Iron at DC Does not want hysterectomy yet as she may want more children  2. Metabolic syndrome PreDM/HTN/Dyslipidemia/Obesity Continue home meds Lifestyle modification, weight loss  3. DVT prophylaxis: SCDs  FULL code Family communication: patient at bedside Dispo: Home tomorrow after transfusion   Time Spent on Admission:  [facility] Pager: [phone] 07/06/2012, 4:34 PM

## 2012-07-06 NOTE — ED Notes (Signed)
Blue, lt green, lavender, and yellow tubes sent to lab

## 2012-07-06 NOTE — ED Provider Notes (Addendum)
History     CSN: [id number]  Arrival date & time 07/06/12  0222   None     Chief Complaint  Patient presents with  . Shortness of Breath    (Consider location/radiation/quality/duration/timing/severity/associated sxs/prior treatment) HPI This is a 1 your black female with a history of polycystic ovary disease and heavy menstrual periods. She is here with a three-day history of shortness of breath, worse with exertion and better with rest. It is moderate in severity. It has been associated with slight chest discomfort on exertion. She has had lightheadedness. She denies nausea or vomiting. She states her hemoglobin was 8.6 last week. She was placed on birth control which has stopped her vaginal bleeding which had been heavy. She states her symptoms are consistent with past episodes of anemia requiring blood transfusion.  Past Medical History  Diagnosis Date  . Obesity, Class III, BMI 40-49.9 (morbid obesity)   . Prediabetes     dx several years ago  . Acid reflux   . Chest pain, atypical     negative ER reports in EPIC  . Blood transfusion without reported diagnosis   . Hypertension     Past Surgical History  Procedure Date  . Diagnostic laparoscopy   . Hysteroscopy w/d&c 04/12/2012    Procedure: DILATATION AND CURETTAGE /HYSTEROSCOPY;  Surgeon: Levi Aland, MD;  Location: [facility];  Service: Gynecology;  Laterality: N/A;  . Ectopic pregnancy surgery     Family History  Problem Relation Age of Onset  . Cancer Other   . COPD Other   . Heart disease Other   . Hyperlipidemia Other   . Stroke Other   . Obesity Other     History  Substance Use Topics  . Smoking status: Never Smoker   . Smokeless tobacco: Never Used  . Alcohol Use: No    OB History    Grav Para Term Preterm Abortions TAB SAB Ect Mult Living   8 4 4  4 1 2 1         Review of Systems  All other systems reviewed and are negative.    Allergies  Review of patient's allergies indicates no known  allergies.  Home Medications   Current Outpatient Rx  Name Route Sig Dispense Refill  . HYDROCHLOROTHIAZIDE 12.5 MG PO CAPS Oral Take 12.5 mg by mouth daily.    Marland Kitchen HYDROCODONE-ACETAMINOPHEN 5-325 MG PO TABS Oral Take 1 tablet by mouth every 6 (six) hours as needed for pain. 15 tablet 0  . IBUPROFEN 800 MG PO TABS Oral Take 1 tablet (800 mg total) by mouth every 8 (eight) hours as needed for pain. 21 tablet 0  . LISINOPRIL 10 MG PO TABS Oral Take 10 mg by mouth daily.    Marland Kitchen MEDROXYPROGESTERONE ACETATE 5 MG PO TABS Oral Take 5 mg by mouth daily.    Marland Kitchen METFORMIN HCL 500 MG PO TABS Oral Take 500 mg by mouth 2 (two) times daily.      BP 147/74  Pulse 100  Temp 98.8 F (37.1 C) (Oral)  Resp 18  Ht 5\' 7"  (1.702 m)  Wt 285 lb (129.275 kg)  BMI 44.64 kg/m2  SpO2 100%  LMP 06/14/2012  Physical Exam General: Well-developed, obese female in no acute distress; appearance consistent with age of record HENT: normocephalic, atraumatic Eyes: pupils equal round and reactive to light; extraocular muscles intact; no conjunctival pallor Neck: supple Heart: regular rate and rhythm Lungs: clear to auscultation bilaterally Abdomen: soft; nondistended; nontender; bowel  sounds present Extremities: No deformity; full range of motion Neurologic: Awake, alert and oriented; motor function intact in all extremities and symmetric; no facial droop Skin: Warm and dry Psychiatric: Normal mood and affect    ED Course  Procedures (including critical care time)     MDM   Nursing notes and vitals signs, including pulse oximetry, reviewed.  Summary of this visit's results, reviewed by myself:  Labs:  Results for orders placed during the hospital encounter of 07/06/12  CBC WITH DIFFERENTIAL      Component Value Range   WBC 7.3  4.0 - 10.5 K/uL   RBC 3.57 (*) 3.87 - 5.11 MIL/uL   Hemoglobin 6.6 (*) 12.0 - 15.0 g/dL   HCT 59.5 (*) 63.8 - 75.6 %   MCV 64.1 (*) 78.0 - 100.0 fL   MCH 18.5 (*) 26.0 - 34.0  pg   MCHC 28.8 (*) 30.0 - 36.0 g/dL   RDW 43.3 (*) 29.5 - 18.8 %   Platelets 308  150 - 400 K/uL   Neutrophils Relative 50  43 - 77 %   Lymphocytes Relative 38  12 - 46 %   Monocytes Relative 10  3 - 12 %   Eosinophils Relative 2  0 - 5 %   Basophils Relative 0  0 - 1 %   Neutro Abs 3.7  1.7 - 7.7 K/uL   Lymphs Abs 2.8  0.7 - 4.0 K/uL   Monocytes Absolute 0.7  0.1 - 1.0 K/uL   Eosinophils Absolute 0.1  0.0 - 0.7 K/uL   Basophils Absolute 0.0  0.0 - 0.1 K/uL   RBC Morphology POLYCHROMASIA PRESENT     WBC Morphology ATYPICAL LYMPHOCYTES     Smear Review PLATELET COUNT CONFIRMED BY SMEAR    COMPREHENSIVE METABOLIC PANEL      Component Value Range   Sodium 140  135 - 145 mEq/L   Potassium 3.2 (*) 3.5 - 5.1 mEq/L   Chloride 101  96 - 112 mEq/L   CO2 27  19 - 32 mEq/L   Glucose, Bld 130 (*) 70 - 99 mg/dL   BUN 7  6 - 23 mg/dL   Creatinine, Ser 4.16  0.50 - 1.10 mg/dL   Calcium 8.7  8.4 - 60.6 mg/dL   Total Protein 7.5  6.0 - 8.3 g/dL   Albumin 3.6  3.5 - 5.2 g/dL   AST 15  0 - 37 U/L   ALT 11  0 - 35 U/L   Alkaline Phosphatase 50  39 - 117 U/L   Total Bilirubin 0.2 (*) 0.3 - 1.2 mg/dL   GFR calc non Af Amer >90  >90 mL/min   GFR calc Af Amer >90  >90 mL/min    Date: 07/06/2012 3:16 AM  Rate: 75  Rhythm: normal sinus rhythm  QRS Axis: normal  Intervals: normal  ST/T Wave abnormalities: normal  Conduction Disutrbances: none  Narrative Interpretation: unremarkable  Comparison with previous EKG: Nonspecific T wave changes have resolved  5:07 AM We'll admit to observation status at [facility] for transfusion.            Hanley Seamen, MD 07/06/12 0450  Hanley Seamen, MD 07/06/12 3016

## 2012-07-06 NOTE — Plan of Care (Signed)
Problem: Phase I Progression Outcomes Goal: Hemodynamically stable Outcome: Progressing HBG is 6.6

## 2012-07-06 NOTE — Plan of Care (Signed)
Problem: Phase I Progression Outcomes Goal: Initial discharge plan identified Outcome: Completed/Met Date Met:  07/06/12 Discharging home

## 2012-07-06 NOTE — ED Notes (Addendum)
Pt reports she is sob with walking- also having mild chest pain- pt reports she had Hgb checked last week and was 8.6- states she has been on her period and her doctor wants it rechecked- states last time she had similar sx her Hgb was 5 and she required a blood transfusion. Pt also reports her doctor started her on BC pills this week to control her bleeding
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
## 2012-07-07 DIAGNOSIS — I10 Essential (primary) hypertension: Secondary | ICD-10-CM

## 2012-07-07 LAB — CBC
Hemoglobin: 9.7 g/dL — ABNORMAL LOW (ref 12.0–15.0)
MCH: 21.9 pg — ABNORMAL LOW (ref 26.0–34.0)
MCHC: 31.6 g/dL (ref 30.0–36.0)
MCV: 69.3 fL — ABNORMAL LOW (ref 78.0–100.0)
RBC: 4.43 MIL/uL (ref 3.87–5.11)

## 2012-07-07 LAB — TYPE AND SCREEN: Unit division: 0

## 2012-07-07 LAB — TROPONIN I: Troponin I: <0.3 ng/mL (ref <0.30)

## 2012-07-07 MED ORDER — INFLUENZA VIRUS VACC SPLIT PF IM SUSP
0.5000 mL | INTRAMUSCULAR | Status: AC
  Administered 2012-07-07: 0.5 mL via INTRAMUSCULAR
  Filled 2012-07-07: qty 0.5

## 2012-07-07 MED ORDER — SODIUM CHLORIDE 0.9 % IV SOLN
1000.0000 mg | Freq: Once | INTRAVENOUS | Status: DC
  Filled 2012-07-07: qty 20

## 2012-07-07 MED ORDER — SODIUM CHLORIDE 0.9 % IV SOLN
25.0000 mg | Freq: Once | INTRAVENOUS | Status: AC
  Administered 2012-07-07: 25 mg via INTRAVENOUS
  Filled 2012-07-07: qty 0.5

## 2012-07-07 MED ORDER — PNEUMOCOCCAL VAC POLYVALENT 25 MCG/0.5ML IJ INJ
0.5000 mL | INJECTION | INTRAMUSCULAR | Status: AC
  Administered 2012-07-07: 0.5 mL via INTRAMUSCULAR
  Filled 2012-07-07: qty 0.5

## 2012-07-07 MED ORDER — SODIUM CHLORIDE 0.9 % IV SOLN
1000.0000 mg | Freq: Once | INTRAVENOUS | Status: AC
  Administered 2012-07-07: 1000 mg via INTRAVENOUS
  Filled 2012-07-07 (×2): qty 20

## 2012-07-07 MED ORDER — FERROUS SULFATE 325 (65 FE) MG PO TABS: 325.0000 mg | ORAL_TABLET | Freq: Two times a day (BID) | ORAL | Status: DC

## 2012-07-07 NOTE — Progress Notes (Signed)
Pt has been educated on D/C information and is prepared to go home. Husband at bedside during D/C. Pt and family have no further questions or concerns.

## 2012-07-07 NOTE — Discharge Summary (Signed)
Physician Discharge Summary  Lynn Pearson:[id number] DOB: 06-11-1976 DOA: 07/06/2012  PCP: Lynn Oats, MD  Admit date: 07/06/2012 Discharge date: 07/07/2012  Time spent: *40 minutes  Recommendations for Outpatient Follow-up:  Check hemoglobin as outpatient in 1-2 weeks.  Discharge Diagnoses:  Active Problems:  Iron deficiency anemia due to chronic blood loss  HTN (hypertension)  Obesity  Pre-diabetes   Discharge Condition: Stable  Diet recommendation: Regular  Filed Weights   07/06/12 0228 07/06/12 0855  Weight: 129.275 kg (285 lb) 131.226 kg (289 lb 4.8 oz)    History of present illness:  [name] is a 36/F with h/o iron deficiency anemia, Heavy menstrual cycles, PCOD, HTN, pre-DM presented [facility] [place] with Dyspnea on exertion, weakness and dizziness since Saturday.  She underwent Hysteroscopy and D&C in Aug/2013 for menorrhagia and possible endometrial polyp, was then referred to an Endocrinologist and Hematology. After that she had a menstrual period 2 and 1/2 weeks ago, which lasted till yesterday and only subsided after she started BCP after she called her Gynecologist. Her menstrual bleeding subsided yesterday. Denies any other overt blood loss, has been taking OTC iron Qdaily. In ER noted to have Hb of 6.6g/dl, down from 9.5-6O/ZH, 2 months ago  Hospital Course:   1. Iron deficiency anemia: Patient has known chronic iron deficiency anemia secondary to dysmenorrhagia. Patient has baseline creatinine of about 8.5-9 gram/dL. She was presented this time with hemoglobin of 6.6. Transfusion of 3 units of packed RBCs was done and hemoglobin was increased to 9.7. Patient denies any hematemesis or melena.  2. Iron deficiency: Iron studies showed iron of 17 and ferritin of less than 1 which is indicating severe iron deficiency, patient already taking 325 mg of oral iron daily. Patient was given 1050 mg of IV iron. A gram of iron and 50 mg  of test dose. Patient tolerated that well, at discharge she was instructed to take oral supplements twice a day instead of once a day.  3. Dysmenorrhagia: This is probably what causing her iron deficiency anemia and her blood loss. According to the admitting physician the patient's not decided about hysterectomy. She'll followup with her obstetric and gynecology.  Procedures:  Transfusion of 3 units of packed RBCs on 07/06/2012.  Infusion of 1000 mg of IV InFeD  Consultations:  None  Discharge Exam: Filed Vitals:   07/07/12 0523 07/07/12 0923 07/07/12 1341 07/07/12 1429  BP: 120/81 118/60 130/78 147/66  Pulse: 91 80 72 78  Temp: 98.2 F (36.8 C) 98 F (36.7 C) 97.5 F (36.4 C) 98.4 F (36.9 C)  TempSrc: Oral Oral Oral Oral  Resp: 20  14 18   Height:      Weight:      SpO2: 96% 99% 100% 100%   General: Alert and awake, oriented x3, not in any acute distress. HEENT: anicteric sclera, pupils reactive to light and accommodation, EOMI CVS: S1-S2 clear, no murmur rubs or gallops Chest: clear to auscultation bilaterally, no wheezing, rales or rhonchi Abdomen: soft nontender, nondistended, normal bowel sounds, no organomegaly Extremities: no cyanosis, clubbing or edema noted bilaterally Neuro: Cranial nerves II-XII intact, no focal neurological deficits  Discharge Instructions  Discharge Orders    Future Orders Please Complete By Expires   Increase activity slowly          Medication List     As of 07/07/2012  4:55 PM    TAKE these medications         CRYSELLE-28 0.3-30 MG-MCG tablet  Generic drug: norgestrel-ethinyl estradiol   Take 1 tablet by mouth daily.      ferrous sulfate 325 (65 FE) MG tablet   Take 1 tablet (325 mg total) by mouth 2 (two) times daily.      hydrochlorothiazide 12.5 MG capsule   Commonly known as: MICROZIDE   Take 12.5 mg by mouth daily.      HYDROcodone-acetaminophen 5-325 MG per tablet   Commonly known as: NORCO/VICODIN   Take 1  tablet by mouth every 6 (six) hours as needed for pain.      ibuprofen 800 MG tablet   Commonly known as: ADVIL,MOTRIN   Take 1 tablet (800 mg total) by mouth every 8 (eight) hours as needed for pain.      lisinopril 10 MG tablet   Commonly known as: PRINIVIL,ZESTRIL   Take 10 mg by mouth daily.      medroxyPROGESTERone 5 MG tablet   Commonly known as: PROVERA   Take 5 mg by mouth daily.      metFORMIN 500 MG tablet   Commonly known as: GLUCOPHAGE   Take 500 mg by mouth 2 (two) times daily.          The results of significant diagnostics from this hospitalization (including imaging, microbiology, ancillary and laboratory) are listed below for reference.    Significant Diagnostic Studies: Dg Chest Port 1 View  07/06/2012  *RADIOLOGY REPORT*  Clinical Data: Chest tightness and shortness of breath.  PORTABLE CHEST - 1 VIEW  Comparison: PA and lateral chest 10/12/2011 and 12/31/2008.  Findings: Lungs are clear.  Heart size is normal.  No pneumothorax or pleural fluid.  IMPRESSION: Negative chest.   Original Report Authenticated By: Lynn Pearson. Lynn Pearson, M.D.     Microbiology: No results found for this or any previous visit (from the past 240 hour(s)).   Labs: Basic Metabolic Panel:  Lab 07/06/12 8657  NA 140  K 3.2*  CL 101  CO2 27  GLUCOSE 130*  BUN 7  CREATININE 0.80  CALCIUM 8.7  MG --  PHOS --   Liver Function Tests:  Lab 07/06/12 0321  AST 15  ALT 11  ALKPHOS 50  BILITOT 0.2*  PROT 7.5  ALBUMIN 3.6   No results found for this basename: LIPASE:5,AMYLASE:5 in the last 168 hours No results found for this basename: AMMONIA:5 in the last 168 hours CBC:  Lab 07/07/12 0647 07/06/12 0321  WBC 7.1 7.3  NEUTROABS -- 3.7  HGB 9.7* 6.6*  HCT 30.7* 22.9*  MCV 69.3* 64.1*  PLT 237 308   Cardiac Enzymes:  Lab 07/06/12 2339 07/06/12 0443  CKTOTAL -- --  CKMB -- --  CKMBINDEX -- --  TROPONINI <0.30 <0.30   BNP: BNP (last 3 results) No results found for  this basename: PROBNP:3 in the last 8760 hours CBG: No results found for this basename: GLUCAP:5 in the last 168 hours     Signed:  [facility] Pearson A  [facility] 07/07/2012, 4:55 PM

## 2012-07-07 NOTE — Progress Notes (Signed)
Event: RN notified that pt c/o chest pressure. Currently receiving 3rd of 3 units PRBC's. EKG and stat PCXR ordered. NP to bedside.  Subjective: Pt reports experiencing mild mid chest pressure that was non-radiating. Pressure was not associated w/ SOB, nausea. dizziness, diaphoresis or other symptoms. Pt states she did have a brief coughing episode after onset of pressure. Pressure lasted approx 5-10 minutes and has since resolved. Objective: Records indicate 36 y/o morbidly obese AA female admitted today for symptomatic anemia s/p chronic menorrhagia. At bedside pt noted sitting up in bed in NAD. Skin w/d, color normal. Current VSS. BP-115/75, T-97.1, P-72, R18 w/ 02 sats of 98% on R/A. 12 lead EKG shows NSR w/o acute changes. Comparable to EKG at time of admission. Stat PCXR negative. BBS diminished at bases, otherwise CTA. No chest wall TTP. Abd is large, soft w/ normal BS. First Trop I at admission negative.  Assessment/Plan: 1. Chest pressure: (Brief episode, now resolved)  Etiology unclear. Normal EKG and CXR. Normal VS. Low index of suspension for cardiac source of pain though pt does have multiple risk factors given, morbid obesity, dyslipidemia, prediabetic and HTN. Will cycle Trop I x 2 and continue to monitor closely.  Leanne Chang, NP-C [facility]  Pager [phone]

## 2012-07-07 NOTE — Care Management Note (Signed)
    Page 1 of 1   07/07/2012     12:33:38 PM   CARE MANAGEMENT NOTE 07/07/2012  Patient:  Lynn Pearson, Lynn Pearson   Account Number:  [id number]  Date Initiated:  07/07/2012  Documentation initiated by:  Letha Cape  Subjective/Objective Assessment:   dx iron def anemia due to chronic blood loss  admit as observation- lives with spouse.  pta independent.     Action/Plan:   Anticipated DC Date:  07/07/2012   Anticipated DC Plan:  HOME/SELF CARE      DC Planning Services  CM consult      Choice offered to / List presented to:             Status of service:  Completed, signed off Medicare Important Message given?   (If response is "NO", the following Medicare IM given date fields will be blank) Date Medicare IM given:   Date Additional Medicare IM given:    Discharge Disposition:  HOME/SELF CARE  Per UR Regulation:  Reviewed for med. necessity/level of care/duration of stay  If discussed at Long Length of Stay Meetings, dates discussed:    Comments:  07/07/12 12:32 Letha Cape RN, BSN [phone] patient lives with spouse, pta independent.  Patient has medication coverage and transportation.  No needs identified. Patient for dc today.

## 2012-08-25 ENCOUNTER — Encounter (HOSPITAL_COMMUNITY): Payer: Self-pay | Admitting: Emergency Medicine

## 2012-08-25 ENCOUNTER — Emergency Department (HOSPITAL_COMMUNITY)
Admission: EM | Admit: 2012-08-25 | Discharge: 2012-08-26 | Disposition: A | Discharge status: Home / Self Care | Payer: BC Managed Care – PPO | Attending: Emergency Medicine | Admitting: Emergency Medicine

## 2012-08-25 DIAGNOSIS — Z79899 Other long term (current) drug therapy: Secondary | ICD-10-CM | POA: Insufficient documentation

## 2012-08-25 DIAGNOSIS — X500XXA Overexertion from strenuous movement or load, initial encounter: Secondary | ICD-10-CM | POA: Insufficient documentation

## 2012-08-25 DIAGNOSIS — Z8719 Personal history of other diseases of the digestive system: Secondary | ICD-10-CM | POA: Insufficient documentation

## 2012-08-25 DIAGNOSIS — Z8742 Personal history of other diseases of the female genital tract: Secondary | ICD-10-CM | POA: Insufficient documentation

## 2012-08-25 DIAGNOSIS — Z6841 Body Mass Index (BMI) 40.0 and over, adult: Secondary | ICD-10-CM | POA: Insufficient documentation

## 2012-08-25 DIAGNOSIS — Z9889 Other specified postprocedural states: Secondary | ICD-10-CM | POA: Insufficient documentation

## 2012-08-25 DIAGNOSIS — Y929 Unspecified place or not applicable: Secondary | ICD-10-CM | POA: Insufficient documentation

## 2012-08-25 DIAGNOSIS — IMO0002 Reserved for concepts with insufficient information to code with codable children: Secondary | ICD-10-CM | POA: Insufficient documentation

## 2012-08-25 DIAGNOSIS — S76019A Strain of muscle, fascia and tendon of unspecified hip, initial encounter: Secondary | ICD-10-CM

## 2012-08-25 DIAGNOSIS — I10 Essential (primary) hypertension: Secondary | ICD-10-CM | POA: Insufficient documentation

## 2012-08-25 DIAGNOSIS — Y9389 Activity, other specified: Secondary | ICD-10-CM | POA: Insufficient documentation

## 2012-08-25 NOTE — ED Notes (Addendum)
Pt reports R groin pain since yesterday.   Denies known injury or urinary complaints.

## 2012-08-25 NOTE — ED Provider Notes (Signed)
History     CSN: [id number]  Arrival date & time 08/25/12  2145   First MD Initiated Contact with Patient 08/25/12 2345      Chief Complaint  Patient presents with  . Groin Pain    (Consider location/radiation/quality/duration/timing/severity/associated sxs/prior treatment) HPI The patient presents with Right groin pain for since yesterday.  She reports getting into an automobile and heard a "pop" and pain.  She reports pain with ambulation and movement.  She denies fall or other trauma to the area.  Denies numbness or tingling, loss of bowel or bladder function.  Denies lower back pain.   Past Medical History  Diagnosis Date  . Obesity, Class III, BMI 40-49.9 (morbid obesity)   . Prediabetes     dx several years ago  . Acid reflux   . Chest pain, atypical     negative ER reports in EPIC  . Blood transfusion without reported diagnosis   . Hypertension   . Polycystic ovarian disease     Past Surgical History  Procedure Date  . Diagnostic laparoscopy   . Hysteroscopy w/d&c 04/12/2012    Procedure: DILATATION AND CURETTAGE /HYSTEROSCOPY;  Surgeon: Levi Aland, MD;  Location: [facility];  Service: Gynecology;  Laterality: N/A;  . Ectopic pregnancy surgery     Family History  Problem Relation Age of Onset  . Cancer Other   . COPD Other   . Heart disease Other   . Hyperlipidemia Other   . Stroke Other   . Obesity Other     History  Substance Use Topics  . Smoking status: Never Smoker   . Smokeless tobacco: Never Used  . Alcohol Use: No    OB History    Grav Para Term Preterm Abortions TAB SAB Ect Mult Living   8 4 4  4 1 2 1         Review of Systems  Allergies  Review of patient's allergies indicates no known allergies.  Home Medications   Current Outpatient Rx  Name  Route  Sig  Dispense  Refill  . FERROUS SULFATE 325 (65 FE) MG PO TABS   Oral   Take 1 tablet (325 mg total) by mouth 2 (two) times daily.   60 tablet   0   . HYDROCHLOROTHIAZIDE  12.5 MG PO CAPS   Oral   Take 12.5 mg by mouth daily.         . IBUPROFEN 800 MG PO TABS   Oral   Take 1 tablet (800 mg total) by mouth every 8 (eight) hours as needed for pain.   21 tablet   0   . LISINOPRIL 10 MG PO TABS   Oral   Take 10 mg by mouth daily.         Marland Kitchen MEDROXYPROGESTERONE ACETATE 5 MG PO TABS   Oral   Take 5 mg by mouth daily.         Marland Kitchen METFORMIN HCL 500 MG PO TABS   Oral   Take 500-1,000 mg by mouth 2 (two) times daily. Takes 1 tab in the morning and 2 tabs at night         . NORGESTREL-ETHINYL ESTRADIOL 0.3-30 MG-MCG PO TABS   Oral   Take 1 tablet by mouth daily.         Marland Kitchen HYDROCODONE-ACETAMINOPHEN 5-325 MG PO TABS   Oral   Take 1 tablet by mouth every 6 (six) hours as needed for pain.   15 tablet  0     BP 150/82  Pulse 73  Temp 97.6 F (36.4 C) (Oral)  Resp 18  SpO2 99%  LMP 07/22/2012  Physical Exam  Nursing note and vitals reviewed. Constitutional: She appears well-developed and well-nourished. She is cooperative. She does not appear ill. No distress.  Pulmonary/Chest: Effort normal.  Musculoskeletal:       Right hip: She exhibits decreased range of motion and tenderness. She exhibits normal strength, no swelling and no deformity.       Legs:      Decrease in ROM secondary to pain.  Pain with hip flexion, and Abduction.  No pain with Adduction. Limping gait.  Neurological: She is alert. She exhibits normal muscle tone.  Reflex Scores:      Patellar reflexes are 2+ on the right side and 2+ on the left side.      Achilles reflexes are 2+ on the right side and 2+ on the left side. Skin: Skin is warm and dry. No rash noted.    ED Course  Procedures (including critical care time)     MDM  The patient presents with R groin pain aggravated by movement.  Without a history of trauma to the area, the patient was encouraged to rest, ice, and take NSAIDs as needed.  Will give the patient Norco to help with the acute phase. Referral  to Ortho and crutches given to the patient.        Carlyle Dolly, PA-C 08/26/12 0052  Carlyle Dolly, PA-C 08/26/12 [phone]

## 2012-08-26 MED ORDER — HYDROCODONE-ACETAMINOPHEN 5-325 MG PO TABS: 1.0000 | ORAL_TABLET | Freq: Four times a day (QID) | ORAL | Status: DC | PRN

## 2012-08-26 NOTE — Progress Notes (Signed)
Orthopedic Tech Progress Note Patient Details:  Lynn Pearson 07-02-1976 [id number]  Ortho Devices Type of Ortho Device: Crutches   Haskell Flirt 08/26/2012, 12:17 AM

## 2012-08-26 NOTE — ED Provider Notes (Signed)
Medical screening examination/treatment/procedure(s) were performed by non-physician practitioner and as supervising physician I was immediately available for consultation/collaboration.   Rosaleigh Brazzel B. Nesa Distel, MD 08/26/12 1504 

## 2013-06-23 ENCOUNTER — Encounter (HOSPITAL_BASED_OUTPATIENT_CLINIC_OR_DEPARTMENT_OTHER): Payer: Self-pay | Admitting: Emergency Medicine

## 2013-06-23 ENCOUNTER — Emergency Department (HOSPITAL_BASED_OUTPATIENT_CLINIC_OR_DEPARTMENT_OTHER)
Admission: EM | Admit: 2013-06-23 | Discharge: 2013-06-23 | Disposition: A | Discharge status: Home / Self Care | Payer: BC Managed Care – PPO | Attending: Emergency Medicine | Admitting: Emergency Medicine

## 2013-06-23 ENCOUNTER — Emergency Department (HOSPITAL_BASED_OUTPATIENT_CLINIC_OR_DEPARTMENT_OTHER): Payer: BC Managed Care – PPO

## 2013-06-23 DIAGNOSIS — J159 Unspecified bacterial pneumonia: Secondary | ICD-10-CM | POA: Insufficient documentation

## 2013-06-23 DIAGNOSIS — Z6841 Body Mass Index (BMI) 40.0 and over, adult: Secondary | ICD-10-CM | POA: Insufficient documentation

## 2013-06-23 DIAGNOSIS — I10 Essential (primary) hypertension: Secondary | ICD-10-CM | POA: Insufficient documentation

## 2013-06-23 DIAGNOSIS — J189 Pneumonia, unspecified organism: Secondary | ICD-10-CM

## 2013-06-23 DIAGNOSIS — Z79899 Other long term (current) drug therapy: Secondary | ICD-10-CM | POA: Insufficient documentation

## 2013-06-23 DIAGNOSIS — Z8719 Personal history of other diseases of the digestive system: Secondary | ICD-10-CM | POA: Insufficient documentation

## 2013-06-23 DIAGNOSIS — Z862 Personal history of diseases of the blood and blood-forming organs and certain disorders involving the immune mechanism: Secondary | ICD-10-CM | POA: Insufficient documentation

## 2013-06-23 DIAGNOSIS — Z8639 Personal history of other endocrine, nutritional and metabolic disease: Secondary | ICD-10-CM | POA: Insufficient documentation

## 2013-06-23 MED ORDER — HYDROCOD POLST-CHLORPHEN POLST 10-8 MG/5ML PO LQCR: 5.0000 mL | Freq: Two times a day (BID) | ORAL | Status: DC | PRN

## 2013-06-23 MED ORDER — ALBUTEROL SULFATE HFA 108 (90 BASE) MCG/ACT IN AERS
2.0000 | INHALATION_SPRAY | RESPIRATORY_TRACT | Status: DC | PRN
  Administered 2013-06-23: 2 via RESPIRATORY_TRACT
  Filled 2013-06-23: qty 6.7

## 2013-06-23 MED ORDER — AZITHROMYCIN 250 MG PO TABS: ORAL_TABLET | ORAL | Status: DC

## 2013-06-23 NOTE — ED Notes (Signed)
rx x 2 for zithromax and tussionex. Ride present

## 2013-06-23 NOTE — ED Notes (Signed)
Pt states that she has had flu like symptoms for several days. Pt states that she has a productive cough

## 2013-06-23 NOTE — ED Provider Notes (Signed)
CSN: [id number]     Arrival date & time 06/23/13  1837 History   First MD Initiated Contact with Patient 06/23/13 1849     This chart was scribed for Charles B. Bernette Mayers, MD by Manuela Schwartz, ED scribe. This patient was seen in room MH03/MH03 and the patient's care was started at 1837.  Chief Complaint  Patient presents with  . Cough  . Fever  . Chills   The history is provided by the patient. No language interpreter was used.   HPI Comments: Lynn Pearson is a 37 y.o. female who presents to the Emergency Department complaining of a constant, gradually worsening, green/yellow colored productive cough, onset 3 days ago with associated fever/chills (max of 101F last PM). She states her daughter has been sick at home w/similar symptoms of URI. She states her coughing is worse at night and when she is laying down to sleep. She is not/has never been a smoker. She states associated posttussive emesis. She has not taken any medicines at home for this problem. She denies any recent travels to [place] or being in contact with anyone recently there.   Past Medical History  Diagnosis Date  . Obesity, Class III, BMI 40-49.9 (morbid obesity)   . Prediabetes     dx several years ago  . Acid reflux   . Chest pain, atypical     negative ER reports in EPIC  . Blood transfusion without reported diagnosis   . Hypertension   . Polycystic ovarian disease    Past Surgical History  Procedure Laterality Date  . Diagnostic laparoscopy    . Hysteroscopy w/d&c  04/12/2012    Procedure: DILATATION AND CURETTAGE /HYSTEROSCOPY;  Surgeon: Levi Aland, MD;  Location: [facility];  Service: Gynecology;  Laterality: N/A;  . Ectopic pregnancy surgery     Family History  Problem Relation Age of Onset  . Cancer Other   . COPD Other   . Heart disease Other   . Hyperlipidemia Other   . Stroke Other   . Obesity Other    History  Substance Use Topics  . Smoking status: Never Smoker   . Smokeless  tobacco: Never Used  . Alcohol Use: No   OB History   Grav Para Term Preterm Abortions TAB SAB Ect Mult Living   8 4 4  4 1 2 1        Review of Systems  Constitutional: Positive for fever and chills.  Respiratory: Positive for cough (green/yellow colored productive cough). Negative for shortness of breath.   Cardiovascular: Negative for chest pain.  Gastrointestinal: Negative for nausea, vomiting and abdominal pain.  Musculoskeletal: Negative for back pain.  Skin: Negative for color change.  Neurological: Negative for weakness.  All other systems reviewed and are negative.   A complete 10 system review of systems was obtained and all systems are negative except as noted in the HPI and PMH.   Allergies  Review of patient's allergies indicates no known allergies.  Home Medications   Current Outpatient Rx  Name  Route  Sig  Dispense  Refill  . ferrous sulfate 325 (65 FE) MG tablet   Oral   Take 1 tablet (325 mg total) by mouth 2 (two) times daily.   60 tablet   0   . hydrochlorothiazide (MICROZIDE) 12.5 MG capsule   Oral   Take 12.5 mg by mouth daily.         Marland Kitchen HYDROcodone-acetaminophen (NORCO/VICODIN) 5-325 MG per tablet  Oral   Take 1 tablet by mouth every 6 (six) hours as needed for pain.   15 tablet   0   . HYDROcodone-acetaminophen (NORCO/VICODIN) 5-325 MG per tablet   Oral   Take 1 tablet by mouth every 6 (six) hours as needed for pain.   10 tablet   0   . ibuprofen (ADVIL,MOTRIN) 800 MG tablet   Oral   Take 1 tablet (800 mg total) by mouth every 8 (eight) hours as needed for pain.   21 tablet   0   . lisinopril (PRINIVIL,ZESTRIL) 10 MG tablet   Oral   Take 10 mg by mouth daily.         . medroxyPROGESTERone (PROVERA) 5 MG tablet   Oral   Take 5 mg by mouth daily.         . metFORMIN (GLUCOPHAGE) 500 MG tablet   Oral   Take 500-1,000 mg by mouth 2 (two) times daily. Takes 1 tab in the morning and 2 tabs at night         .  norgestrel-ethinyl estradiol (CRYSELLE-28) 0.3-30 MG-MCG tablet   Oral   Take 1 tablet by mouth daily.          Triage Vitals: BP 149/78  Pulse 99  Temp(Src) 98.9 F (37.2 C) (Oral)  Ht 5\' 8"  (1.727 m)  Wt 275 lb (124.739 kg)  BMI 41.82 kg/m2  SpO2 97%  LMP 06/14/2013 Physical Exam  Nursing note and vitals reviewed. Constitutional: She is oriented to person, place, and time. She appears well-developed and well-nourished.  HENT:  Head: Normocephalic and atraumatic.  Eyes: EOM are normal. Pupils are equal, round, and reactive to light.  Neck: Normal range of motion. Neck supple.  Cardiovascular: Normal rate, normal heart sounds and intact distal pulses.   Pulmonary/Chest: Effort normal and breath sounds normal. She has no wheezes. She has no rales.  Decreased air movement bilaterally.   Abdominal: Bowel sounds are normal. She exhibits no distension. There is no tenderness.  Musculoskeletal: Normal range of motion. She exhibits no edema and no tenderness.  Neurological: She is alert and oriented to person, place, and time. She has normal strength. No cranial nerve deficit or sensory deficit.  Skin: Skin is warm and dry. No rash noted.  Psychiatric: She has a normal mood and affect.    ED Course  Procedures (including critical care time) DIAGNOSTIC STUDIES: Oxygen Saturation is 97% on room air, normal by my interpretation.    COORDINATION OF CARE: At 58 PM Discussed treatment plan with patient which includes CXR. Patient agrees.   7:52 PM CXR reviewed, early infiltrate. Will give Rx for Z-pak, Tussionex and PCP followup.   Labs Review Labs Reviewed - No data to display Imaging Review Dg Chest 2 View  06/23/2013   CLINICAL DATA:  Cough and fever.  EXAM: CHEST  2 VIEW  COMPARISON:  CHEST x-ray 07/06/2012.  FINDINGS: Ill-defined opacity projecting over the right lower lung on the frontal view, which corresponds with increased density over the lower thoracic spine on the  lateral view, compatible with right lower lobe pneumonia. No pleural effusions. No pneumothorax. No evidence of pulmonary edema. Heart size and mediastinal contours are within normal limits.  IMPRESSION: 1. Right lower lobe airspace consolidation concerning for pneumonia or sequela of aspiration. Repeat PA and lateral chest radiograph in 2-3 weeks after appropriate trial of antibiotic reveal therapy is recommended to ensure resolution of this finding (i.e., to exclude a centrally obstructing process).  Electronically Signed   By: Trudie Reed M.D.   On: 06/23/2013 19:36    EKG Interpretation   None       MDM   1. CAP (community acquired pneumonia)    I personally performed the services described in this documentation, which was scribed in my presence. The recorded information has been reviewed and is accurate.        Charles B. Bernette Mayers, MD 06/23/13 [phone]

## 2013-07-14 ENCOUNTER — Other Ambulatory Visit: Payer: Self-pay

## 2013-12-06 ENCOUNTER — Encounter (HOSPITAL_BASED_OUTPATIENT_CLINIC_OR_DEPARTMENT_OTHER): Payer: Self-pay | Admitting: Emergency Medicine

## 2013-12-06 ENCOUNTER — Emergency Department (HOSPITAL_BASED_OUTPATIENT_CLINIC_OR_DEPARTMENT_OTHER)
Admission: EM | Admit: 2013-12-06 | Discharge: 2013-12-07 | Disposition: A | Discharge status: Home / Self Care | Payer: BC Managed Care – PPO | Attending: Emergency Medicine | Admitting: Emergency Medicine

## 2013-12-06 ENCOUNTER — Emergency Department (HOSPITAL_BASED_OUTPATIENT_CLINIC_OR_DEPARTMENT_OTHER): Payer: BC Managed Care – PPO

## 2013-12-06 DIAGNOSIS — R110 Nausea: Secondary | ICD-10-CM

## 2013-12-06 DIAGNOSIS — R079 Chest pain, unspecified: Secondary | ICD-10-CM | POA: Insufficient documentation

## 2013-12-06 DIAGNOSIS — Z8719 Personal history of other diseases of the digestive system: Secondary | ICD-10-CM | POA: Insufficient documentation

## 2013-12-06 DIAGNOSIS — Z862 Personal history of diseases of the blood and blood-forming organs and certain disorders involving the immune mechanism: Secondary | ICD-10-CM | POA: Insufficient documentation

## 2013-12-06 DIAGNOSIS — I10 Essential (primary) hypertension: Secondary | ICD-10-CM | POA: Insufficient documentation

## 2013-12-06 DIAGNOSIS — R197 Diarrhea, unspecified: Secondary | ICD-10-CM | POA: Insufficient documentation

## 2013-12-06 DIAGNOSIS — Z79899 Other long term (current) drug therapy: Secondary | ICD-10-CM | POA: Insufficient documentation

## 2013-12-06 DIAGNOSIS — R109 Unspecified abdominal pain: Secondary | ICD-10-CM | POA: Insufficient documentation

## 2013-12-06 DIAGNOSIS — R42 Dizziness and giddiness: Secondary | ICD-10-CM | POA: Insufficient documentation

## 2013-12-06 DIAGNOSIS — R6883 Chills (without fever): Secondary | ICD-10-CM | POA: Insufficient documentation

## 2013-12-06 DIAGNOSIS — Z3202 Encounter for pregnancy test, result negative: Secondary | ICD-10-CM | POA: Insufficient documentation

## 2013-12-06 DIAGNOSIS — Z8639 Personal history of other endocrine, nutritional and metabolic disease: Secondary | ICD-10-CM | POA: Insufficient documentation

## 2013-12-06 LAB — COMPREHENSIVE METABOLIC PANEL
ALK PHOS: 69 U/L (ref 39–117)
ALT: 18 U/L (ref 0–35)
AST: 23 U/L (ref 0–37)
Albumin: 3.9 g/dL (ref 3.5–5.2)
BILIRUBIN TOTAL: 0.6 mg/dL (ref 0.3–1.2)
BUN: 8 mg/dL (ref 6–23)
CHLORIDE: 97 meq/L (ref 96–112)
CO2: 26 mEq/L (ref 19–32)
Calcium: 8.8 mg/dL (ref 8.4–10.5)
Creatinine, Ser: 0.7 mg/dL (ref 0.50–1.10)
GFR calc non Af Amer: >90 mL/min (ref >90)
GLUCOSE: 125 mg/dL — AB (ref 70–99)
Potassium: 3.4 mEq/L — ABNORMAL LOW (ref 3.7–5.3)
Sodium: 137 mEq/L (ref 137–147)
Total Protein: 8.3 g/dL (ref 6.0–8.3)

## 2013-12-06 LAB — CBC WITH DIFFERENTIAL/PLATELET
Basophils Absolute: 0 10*3/uL (ref 0.0–0.1)
Basophils Relative: 0 % (ref 0–1)
Eosinophils Absolute: 0.1 10*3/uL (ref 0.0–0.7)
Eosinophils Relative: 1 % (ref 0–5)
HCT: 41.4 % (ref 36.0–46.0)
HEMOGLOBIN: 13.7 g/dL (ref 12.0–15.0)
LYMPHS ABS: 1 10*3/uL (ref 0.7–4.0)
LYMPHS PCT: 17 % (ref 12–46)
MCH: 26.6 pg (ref 26.0–34.0)
MCHC: 33.1 g/dL (ref 30.0–36.0)
MCV: 80.4 fL (ref 78.0–100.0)
MONOS PCT: 7 % (ref 3–12)
Monocytes Absolute: 0.4 10*3/uL (ref 0.1–1.0)
NEUTROS ABS: 4.6 10*3/uL (ref 1.7–7.7)
Neutrophils Relative %: 75 % (ref 43–77)
PLATELETS: 185 10*3/uL (ref 150–400)
RBC: 5.15 MIL/uL — AB (ref 3.87–5.11)
RDW: 14.7 % (ref 11.5–15.5)
WBC: 6.1 10*3/uL (ref 4.0–10.5)

## 2013-12-06 LAB — URINALYSIS, ROUTINE W REFLEX MICROSCOPIC
Bilirubin Urine: NEGATIVE
Glucose, UA: NEGATIVE mg/dL
Hgb urine dipstick: NEGATIVE
KETONES UR: NEGATIVE mg/dL
NITRITE: NEGATIVE
PROTEIN: NEGATIVE mg/dL
Specific Gravity, Urine: 1.006 (ref 1.005–1.030)
UROBILINOGEN UA: 0.2 mg/dL (ref 0.0–1.0)
pH: 7.5 (ref 5.0–8.0)

## 2013-12-06 LAB — TROPONIN I
Troponin I: <0.3 ng/mL (ref <0.30)
Troponin I: <0.3 ng/mL (ref <0.30)

## 2013-12-06 LAB — PREGNANCY, URINE: PREG TEST UR: NEGATIVE

## 2013-12-06 LAB — URINE MICROSCOPIC-ADD ON

## 2013-12-06 LAB — LIPASE, BLOOD: LIPASE: 39 U/L (ref 11–59)

## 2013-12-06 MED ORDER — DICYCLOMINE HCL 10 MG PO CAPS
10.0000 mg | ORAL_CAPSULE | Freq: Once | ORAL | Status: AC
  Administered 2013-12-06: 10 mg via ORAL
  Filled 2013-12-06: qty 1

## 2013-12-06 MED ORDER — SODIUM CHLORIDE 0.9 % IV BOLUS (SEPSIS)
1000.0000 mL | Freq: Once | INTRAVENOUS | Status: AC
  Administered 2013-12-06: 1000 mL via INTRAVENOUS

## 2013-12-06 MED ORDER — ONDANSETRON HCL 4 MG/2ML IJ SOLN
4.0000 mg | Freq: Once | INTRAMUSCULAR | Status: AC
  Administered 2013-12-06: 4 mg via INTRAVENOUS
  Filled 2013-12-06: qty 2

## 2013-12-06 MED ORDER — DICYCLOMINE HCL 20 MG PO TABS: 20.0000 mg | ORAL_TABLET | Freq: Two times a day (BID) | ORAL | Status: DC

## 2013-12-06 MED ORDER — ONDANSETRON HCL 4 MG PO TABS: 4.0000 mg | ORAL_TABLET | Freq: Four times a day (QID) | ORAL | Status: DC

## 2013-12-06 MED ORDER — KETOROLAC TROMETHAMINE 30 MG/ML IJ SOLN
30.0000 mg | Freq: Once | INTRAMUSCULAR | Status: AC
  Administered 2013-12-06: 30 mg via INTRAVENOUS
  Filled 2013-12-06: qty 1

## 2013-12-06 NOTE — ED Provider Notes (Signed)
This chart was scribed for Lynn MawKristen N Ndidi Nesby, DO by Dorothey Basemania Sutton, ED Scribe. This patient was seen in room MH05/MH05 and the patient's care was started at 7:48 PM.  CHIEF COMPLAINT: nausea, chills  HPI:  HPI Comments: Lynn Pearson is a 38 y.o. female who presents to the Emergency Department complaining of constant nausea with associated mild, watery diarrhea, diffuse abdominal pian, chills, and dizziness onset a few hours ago. She reports some associated, pressure-like chest pain that she states is alleviated with laying on her side, but denies any other exacerbating factors. The chest pressure started while in the waiting room. She denies shortness of breath, fever, cough, emesis, diaphoresis. Patient reports that she has been exposed with sick contacts with similar GI symptoms at work. She reports a familial history of cardiac problems (maternal grandmother). Patient denies personal history of DVT/PE or cardiac problems, fractures or trauma, recent surgeries or hospitalizations, prolonged periods of immobilization. She is on exogenous hormones. No tobacco use. She is currently on hCG drops for weight loss. Patient has a history of prediabetes, acid reflux, HTN, and polycystic ovarian disease.    ROS: See HPI Constitutional: chills, no fever  Eyes: no drainage  ENT: no runny nose   Cardiovascular: chest pain  Resp: no SOB, no cough GI: nausea, diarrhea, abdominal pain, no vomiting GU: no dysuria Integumentary: no rash  Allergy: no hives  Musculoskeletal: no leg swelling  Neurological: dizziness, no slurred speech ROS otherwise negative  PAST MEDICAL HISTORY/PAST SURGICAL HISTORY:  Past Medical History  Diagnosis Date  . Obesity, Class III, BMI 40-49.9 (morbid obesity)   . Prediabetes     dx several years ago  . Acid reflux   . Chest pain, atypical     negative ER reports in EPIC  . Blood transfusion without reported diagnosis   . Hypertension   . Polycystic ovarian disease      MEDICATIONS:  Prior to Admission medications   Medication Sig Start Date End Date Taking? Authorizing Provider  azithromycin (ZITHROMAX) 250 MG tablet Use as directed 06/23/13   Charles B. Bernette MayersSheldon, MD  chlorpheniramine-HYDROcodone [facility](TUSSIONEX PENNKINETIC ER) 10-8 MG/5ML LQCR Take 5 mLs by mouth every 12 (twelve) hours as needed (cough). 06/23/13   Charles B. Bernette MayersSheldon, MD  lisinopril (PRINIVIL,ZESTRIL) 10 MG tablet Take 10 mg by mouth daily.    Historical Provider, MD    ALLERGIES:  No Known Allergies  SOCIAL HISTORY:  History  Substance Use Topics  . Smoking status: Never Smoker   . Smokeless tobacco: Never Used  . Alcohol Use: No    FAMILY HISTORY: Family History  Problem Relation Age of Onset  . Cancer Other   . COPD Other   . Heart disease Other   . Hyperlipidemia Other   . Stroke Other   . Obesity Other     EXAM: Triage Vitals: BP 169/90  Pulse 94  Temp(Src) 98.4 F (36.9 C) (Oral)  Resp 20  Ht 5\' 8"  (1.727 m)  Wt 275 lb (124.739 kg)  BMI 41.82 kg/m2  SpO2 100%  LMP 11/05/2013  CONSTITUTIONAL: Alert and oriented and responds appropriately to questions. Well-appearing; well-nourished; obese HEAD: Normocephalic EYES: Conjunctivae clear, PERRL ENT: normal nose; no rhinorrhea; moist mucous membranes; pharynx without lesions noted NECK: Supple, no meningismus, no LAD  CARD: RRR; S1 and S2 appreciated; no murmurs, no clicks, no rubs, no gallops RESP: Normal chest excursion without splinting or tachypnea; breath sounds clear and equal bilaterally; no wheezes, no rhonchi, no rales,  ABD/GI: Normal bowel sounds; non-distended; soft, no rebound, no guarding; mild diffuse tenderness, no peritoneal signs BACK:  The back appears normal and is non-tender to palpation, there is no CVA tenderness EXT: Normal ROM in all joints; non-tender to palpation; no edema; normal capillary refill; no cyanosis    SKIN: Normal color for age and race; warm NEURO: Moves all extremities  equally PSYCH: The patient's mood and manner are appropriate. Grooming and personal hygiene are appropriate.  MEDICAL DECISION MAKING:  Patient presents to the ED with nausea and diarrhea, but no vomiting, and has been exposed to sick contacts. Suspect that symptoms are likely viral in nature, but will order an abdominal x-ray and UA. She also reports some chest pain and is relative low-risk for cardiac problems, but does have a h/o HTN now off meds and a familial history of cardiac disease. We'll give IV fluids, Zofran and Bentyl. Given her benign exam I do not feel she needs a CT scan at this time.  ED PROGRESS:  DIAGNOSTIC STUDIES: Oxygen Saturation is 100% on room air, normal by my interpretation.    COORDINATION OF CARE: 7:52 PM- Ordered abdominal x-ray, CMP, lipase, troponin, CBC, urine pregnancy, UA, and an EKG. Ordered IV fluids, Zofran, and Bentyl. Discussed treatment plan with patient at bedside and patient verbalized agreement.    9:15 PM  Pt reports she still having mild pressure in her abdomen and chest her labs including troponin are negative. X-ray unremarkable. Urine shows no sign of infection and pregnancy test is negative. We'll check second troponin and give Toradol and reassess.  11:47 PM  Patient's second troponin is negative. Patient does have risk factors for ACS but I feel her presentation is atypical. Suspect this is a viral gastroenteritis. We'll discharge home with return precautions and supportive care instructions. Patient verbalizes understanding is comfortable with plan.  Results for orders placed during the hospital encounter of 12/06/13  PREGNANCY, URINE      Result Value Ref Range   Preg Test, Ur NEGATIVE  NEGATIVE  URINALYSIS, ROUTINE W REFLEX MICROSCOPIC      Result Value Ref Range   Color, Urine YELLOW  YELLOW   APPearance CLEAR  CLEAR   Specific Gravity, Urine 1.006  1.005 - 1.030   pH 7.5  5.0 - 8.0   Glucose, UA NEGATIVE  NEGATIVE mg/dL   Hgb urine  dipstick NEGATIVE  NEGATIVE   Bilirubin Urine NEGATIVE  NEGATIVE   Ketones, ur NEGATIVE  NEGATIVE mg/dL   Protein, ur NEGATIVE  NEGATIVE mg/dL   Urobilinogen, UA 0.2  0.0 - 1.0 mg/dL   Nitrite NEGATIVE  NEGATIVE   Leukocytes, UA TRACE (*) NEGATIVE  URINE MICROSCOPIC-ADD ON      Result Value Ref Range   Squamous Epithelial / LPF RARE  RARE   WBC, UA 0-2  <3 WBC/hpf   Bacteria, UA RARE  RARE  CBC WITH DIFFERENTIAL      Result Value Ref Range   WBC 6.1  4.0 - 10.5 K/uL   RBC 5.15 (*) 3.87 - 5.11 MIL/uL   Hemoglobin 13.7  12.0 - 15.0 g/dL   HCT 16.1  09.6 - 04.5 %   MCV 80.4  78.0 - 100.0 fL   MCH 26.6  26.0 - 34.0 pg   MCHC 33.1  30.0 - 36.0 g/dL   RDW 40.9  81.1 - 91.4 %   Platelets 185  150 - 400 K/uL   Neutrophils Relative % 75  43 - 77 %  Neutro Abs 4.6  1.7 - 7.7 K/uL   Lymphocytes Relative 17  12 - 46 %   Lymphs Abs 1.0  0.7 - 4.0 K/uL   Monocytes Relative 7  3 - 12 %   Monocytes Absolute 0.4  0.1 - 1.0 K/uL   Eosinophils Relative 1  0 - 5 %   Eosinophils Absolute 0.1  0.0 - 0.7 K/uL   Basophils Relative 0  0 - 1 %   Basophils Absolute 0.0  0.0 - 0.1 K/uL  COMPREHENSIVE METABOLIC PANEL      Result Value Ref Range   Sodium 137  137 - 147 mEq/L   Potassium 3.4 (*) 3.7 - 5.3 mEq/L   Chloride 97  96 - 112 mEq/L   CO2 26  19 - 32 mEq/L   Glucose, Bld 125 (*) 70 - 99 mg/dL   BUN 8  6 - 23 mg/dL   Creatinine, Ser 1.61  0.50 - 1.10 mg/dL   Calcium 8.8  8.4 - 09.6 mg/dL   Total Protein 8.3  6.0 - 8.3 g/dL   Albumin 3.9  3.5 - 5.2 g/dL   AST 23  0 - 37 U/L   ALT 18  0 - 35 U/L   Alkaline Phosphatase 69  39 - 117 U/L   Total Bilirubin 0.6  0.3 - 1.2 mg/dL   GFR calc non Af Amer >90  >90 mL/min   GFR calc Af Amer >90  >90 mL/min  LIPASE, BLOOD      Result Value Ref Range   Lipase 39  11 - 59 U/L  TROPONIN I      Result Value Ref Range   Troponin I <0.30  <0.30 ng/mL   Dg Abd Acute W/chest  12/06/2013   CLINICAL DATA:  Nausea, Mild watery diarrhea and diffuse  abdominal pain  EXAM: ACUTE ABDOMEN SERIES (ABDOMEN 2 VIEW & CHEST 1 VIEW)  COMPARISON:  Prior chest x-ray 06/23/2013  FINDINGS: There is no evidence of dilated bowel loops or free intraperitoneal air. No radiopaque calculi or other significant radiographic abnormality is seen. Heart size and mediastinal contours are within normal limits. Both lungs are clear. Lower lumbar facet arthropathy.  IMPRESSION: Negative abdominal radiographs.  No acute cardiopulmonary disease.   Electronically Signed   By: Malachy Moan M.D.   On: 12/06/2013 21:01      I personally performed the services described in this documentation, which was scribed in my presence. The recorded information has been reviewed and is accurate.    Lynn Maw Nam Vossler, DO 12/06/13 [id number]

## 2013-12-06 NOTE — ED Notes (Signed)
Nausea and chills x 2 hours.

## 2013-12-06 NOTE — Discharge Instructions (Signed)
Abdominal Pain, Women °Abdominal (stomach, pelvic, or belly) pain can be caused by many things. It is important to tell your doctor: °· The location of the pain. °· Does it come and go or is it present all the time? °· Are there things that start the pain (eating certain foods, exercise)? °· Are there other symptoms associated with the pain (fever, nausea, vomiting, diarrhea)? °All of this is helpful to know when trying to find the cause of the pain. °CAUSES  °· Stomach: virus or bacteria infection, or ulcer. °· Intestine: appendicitis (inflamed appendix), regional ileitis (Crohn's disease), ulcerative colitis (inflamed colon), irritable bowel syndrome, diverticulitis (inflamed diverticulum of the colon), or cancer of the stomach or intestine. °· Gallbladder disease or stones in the gallbladder. °· Kidney disease, kidney stones, or infection. °· Pancreas infection or cancer. °· Fibromyalgia (pain disorder). °· Diseases of the female organs: °· Uterus: fibroid (non-cancerous) tumors or infection. °· Fallopian tubes: infection or tubal pregnancy. °· Ovary: cysts or tumors. °· Pelvic adhesions (scar tissue). °· Endometriosis (uterus lining tissue growing in the pelvis and on the pelvic organs). °· Pelvic congestion syndrome (female organs filling up with blood just before the menstrual period). °· Pain with the menstrual period. °· Pain with ovulation (producing an egg). °· Pain with an IUD (intrauterine device, birth control) in the uterus. °· Cancer of the female organs. °· Functional pain (pain not caused by a disease, may improve without treatment). °· Psychological pain. °· Depression. °DIAGNOSIS  °Your doctor will decide the seriousness of your pain by doing an examination. °· Blood tests. °· X-rays. °· Ultrasound. °· CT scan (computed tomography, special type of X-ray). °· MRI (magnetic resonance imaging). °· Cultures, for infection. °· Barium enema (dye inserted in the large intestine, to better view it with  X-rays). °· Colonoscopy (looking in intestine with a lighted tube). °· Laparoscopy (minor surgery, looking in abdomen with a lighted tube). °· Major abdominal exploratory surgery (looking in abdomen with a large incision). °TREATMENT  °The treatment will depend on the cause of the pain.  °· Many cases can be observed and treated at home. °· Over-the-counter medicines recommended by your caregiver. °· Prescription medicine. °· Antibiotics, for infection. °· Birth control pills, for painful periods or for ovulation pain. °· Hormone treatment, for endometriosis. °· Nerve blocking injections. °· Physical therapy. °· Antidepressants. °· Counseling with a psychologist or psychiatrist. °· Minor or major surgery. °HOME CARE INSTRUCTIONS  °· Do not take laxatives, unless directed by your caregiver. °· Take over-the-counter pain medicine only if ordered by your caregiver. Do not take aspirin because it can cause an upset stomach or bleeding. °· Try a clear liquid diet (broth or water) as ordered by your caregiver. Slowly move to a bland diet, as tolerated, if the pain is related to the stomach or intestine. °· Have a thermometer and take your temperature several times a day, and record it. °· Bed rest and sleep, if it helps the pain. °· Avoid sexual intercourse, if it causes pain. °· Avoid stressful situations. °· Keep your follow-up appointments and tests, as your caregiver orders. °· If the pain does not go away with medicine or surgery, you may try: °· Acupuncture. °· Relaxation exercises (yoga, meditation). °· Group therapy. °· Counseling. °SEEK MEDICAL CARE IF:  °· You notice certain foods cause stomach pain. °· Your home care treatment is not helping your pain. °· You need stronger pain medicine. °· You want your IUD removed. °· You feel faint or   lightheaded.  You develop nausea and vomiting.  You develop a rash.  You are having side effects or an allergy to your medicine. SEEK IMMEDIATE MEDICAL CARE IF:   Your  pain does not go away or gets worse.  You have a fever.  Your pain is felt only in portions of the abdomen. The right side could possibly be appendicitis. The left lower portion of the abdomen could be colitis or diverticulitis.  You are passing blood in your stools (bright red or black tarry stools, with or without vomiting).  You have blood in your urine.  You develop chills, with or without a fever.  You pass out. MAKE SURE YOU:   Understand these instructions.  Will watch your condition.  Will get help right away if you are not doing well or get worse. Document Released: 06/22/2007 Document Revised: 11/17/2011 Document Reviewed: 07/12/2009 [facility] Patient Information 2014 [place], [place].  Chest Pain (Nonspecific) It is often hard to give a specific diagnosis for the cause of chest pain. There is always a chance that your pain could be related to something serious, such as a heart attack or a blood clot in the lungs. You need to follow up with your caregiver for further evaluation. CAUSES   Heartburn.  Pneumonia or bronchitis.  Anxiety or stress.  Inflammation around your heart (pericarditis) or lung (pleuritis or pleurisy).  A blood clot in the lung.  A collapsed lung (pneumothorax). It can develop suddenly on its own (spontaneous pneumothorax) or from injury (trauma) to the chest.  Shingles infection (herpes zoster virus). The chest wall is composed of bones, muscles, and cartilage. Any of these can be the source of the pain.  The bones can be bruised by injury.  The muscles or cartilage can be strained by coughing or overwork.  The cartilage can be affected by inflammation and become sore (costochondritis). DIAGNOSIS  Lab tests or other studies, such as X-rays, electrocardiography, stress testing, or cardiac imaging, may be needed to find the cause of your pain.  TREATMENT   Treatment depends on what may be causing your chest pain. Treatment may  include:  Acid blockers for heartburn.  Anti-inflammatory medicine.  Pain medicine for inflammatory conditions.  Antibiotics if an infection is present.  You may be advised to change lifestyle habits. This includes stopping smoking and avoiding alcohol, caffeine, and chocolate.  You may be advised to keep your head raised (elevated) when sleeping. This reduces the chance of acid going backward from your stomach into your esophagus.  Most of the time, nonspecific chest pain will improve within 2 to 3 days with rest and mild pain medicine. HOME CARE INSTRUCTIONS   If antibiotics were prescribed, take your antibiotics as directed. Finish them even if you start to feel better.  For the next few days, avoid physical activities that bring on chest pain. Continue physical activities as directed.  Do not smoke.  Avoid drinking alcohol.  Only take over-the-counter or prescription medicine for pain, discomfort, or fever as directed by your caregiver.  Follow your caregiver's suggestions for further testing if your chest pain does not go away.  Keep any follow-up appointments you made. If you do not go to an appointment, you could develop lasting (chronic) problems with pain. If there is any problem keeping an appointment, you must call to reschedule. SEEK MEDICAL CARE IF:   You think you are having problems from the medicine you are taking. Read your medicine instructions carefully.  Your chest pain  does not go away, even after treatment. °· You develop a rash with blisters on your chest. °SEEK IMMEDIATE MEDICAL CARE IF:  °· You have increased chest pain or pain that spreads to your arm, neck, jaw, back, or abdomen. °· You develop shortness of breath, an increasing cough, or you are coughing up blood. °· You have severe back or abdominal pain, feel nauseous, or vomit. °· You develop severe weakness, fainting, or chills. °· You have a fever. °THIS IS AN EMERGENCY. Do not wait to see if the  pain will go away. Get medical help at once. Call your local emergency services (911 in [place] Do not drive yourself to the hospital. °MAKE SURE YOU:  °· Understand these instructions. °· Will watch your condition. °· Will get help right away if you are not doing well or get worse. °Document Released: 06/04/2005 Document Revised: 11/17/2011 Document Reviewed: 03/30/2008 °[employer]® Patient Information ©2014 [employer]. ° °

## 2013-12-06 NOTE — ED Notes (Signed)
Told registration she is having pressure in her chest. EKG ordered.

## 2014-07-10 ENCOUNTER — Encounter (HOSPITAL_BASED_OUTPATIENT_CLINIC_OR_DEPARTMENT_OTHER): Payer: Self-pay | Admitting: Emergency Medicine

## 2014-12-18 ENCOUNTER — Inpatient Hospital Stay (HOSPITAL_COMMUNITY)
Admission: AD | Admit: 2014-12-18 | Discharge: 2014-12-18 | Disposition: A | Discharge status: Home / Self Care | Payer: BLUE CROSS/BLUE SHIELD | Source: Ambulatory Visit | Attending: Obstetrics and Gynecology | Admitting: Obstetrics and Gynecology

## 2014-12-18 ENCOUNTER — Encounter (HOSPITAL_COMMUNITY): Payer: Self-pay | Admitting: *Deleted

## 2014-12-18 DIAGNOSIS — R0602 Shortness of breath: Secondary | ICD-10-CM | POA: Insufficient documentation

## 2014-12-18 DIAGNOSIS — N939 Abnormal uterine and vaginal bleeding, unspecified: Secondary | ICD-10-CM | POA: Diagnosis not present

## 2014-12-18 DIAGNOSIS — Z6841 Body Mass Index (BMI) 40.0 and over, adult: Secondary | ICD-10-CM | POA: Insufficient documentation

## 2014-12-18 DIAGNOSIS — N920 Excessive and frequent menstruation with regular cycle: Secondary | ICD-10-CM

## 2014-12-18 DIAGNOSIS — K219 Gastro-esophageal reflux disease without esophagitis: Secondary | ICD-10-CM | POA: Diagnosis not present

## 2014-12-18 DIAGNOSIS — D509 Iron deficiency anemia, unspecified: Secondary | ICD-10-CM | POA: Insufficient documentation

## 2014-12-18 DIAGNOSIS — E282 Polycystic ovarian syndrome: Secondary | ICD-10-CM | POA: Diagnosis not present

## 2014-12-18 LAB — CBC
HEMATOCRIT: 30.8 % — AB (ref 36.0–46.0)
HEMOGLOBIN: 9.7 g/dL — AB (ref 12.0–15.0)
MCH: 22.9 pg — ABNORMAL LOW (ref 26.0–34.0)
MCHC: 31.5 g/dL (ref 30.0–36.0)
MCV: 72.8 fL — ABNORMAL LOW (ref 78.0–100.0)
Platelets: 249 10*3/uL (ref 150–400)
RBC: 4.23 MIL/uL (ref 3.87–5.11)
RDW: 15.3 % (ref 11.5–15.5)
WBC: 8.5 10*3/uL (ref 4.0–10.5)

## 2014-12-18 LAB — URINALYSIS, ROUTINE W REFLEX MICROSCOPIC
Bilirubin Urine: NEGATIVE
Glucose, UA: >1000 mg/dL — AB
Ketones, ur: 15 mg/dL — AB
NITRITE: POSITIVE — AB
PH: 7 (ref 5.0–8.0)
Protein, ur: 100 mg/dL — AB
SPECIFIC GRAVITY, URINE: 1.025 (ref 1.005–1.030)
UROBILINOGEN UA: 1 mg/dL (ref 0.0–1.0)

## 2014-12-18 LAB — URINE MICROSCOPIC-ADD ON

## 2014-12-18 LAB — POCT PREGNANCY, URINE: PREG TEST UR: NEGATIVE

## 2014-12-18 NOTE — MAU Note (Signed)
Bleeding since 3/11, slows down @ times but doesn't stop - taking oral contraceptive for the bleeding.  Bleeding is excessive at times, passing clots, changes a pad every hour.  Began having SOB yesterday, feeling weak.

## 2014-12-18 NOTE — MAU Provider Note (Signed)
History     CSN: [id number]  Arrival date and time: 12/18/14 1112   First Provider Initiated Contact with Patient 12/18/14 1346      Chief Complaint  Patient presents with  . Vaginal Bleeding  . Shortness of Breath   HPI [name] is a 39 year old [phone] with PMH of iron-deficiency anemia and PCOS who presents for excessive vaginal bleeding. She reports having vaginal bleeding since 11/17/2014. On 12/15/2014 she was seen by her PCP (Dr. Primus Bravo - [facility]) and prescribed OCP's (Cryselle 0.3mg /0.03mg ) and Tranexamic Acid and says they have helped with the severity of the bleeding. Over the past month, she had been changing pads every hour and also noted lots of clots. Since starting the OCP on Friday, the bleeding has improved in severity. She took 2 pills last night and says she has only had to change her pad 3 times this morning. Over the past few days, her symptoms associated with the bleeding have increased in severity and she is now having shortness of breath, dizziness, and weakness. Her LMP was 11/06/2014 before this bleeding started on 11/17/2014. She reports her periods are usually 1.5 weeks in duration and occur about every month, although sometimes they are irregular.   She reports these episodes of excessive bleeding have occurred in the past. She reports being hospitalized here at [facility] in 2013 and having to receive transfusions. She also reports receiving transfusions in 2015, but at a [place] facility. She denies having ever being diagnosed with a bleeding disorder and reports no one in her family has a bleeding disorder that she is aware of.  She reports taking an OTC iron supplement daily for her anemia. She reports no longer having diabetes or HTN at this time.  Past Medical History  Diagnosis Date  . Obesity, Class III, BMI 40-49.9 (morbid obesity)   . Prediabetes     dx several years ago  . Acid reflux   . Chest pain, atypical     negative  ER reports in EPIC  . Blood transfusion without reported diagnosis   . Hypertension   . Polycystic ovarian disease     Past Surgical History  Procedure Laterality Date  . Diagnostic laparoscopy    . Hysteroscopy w/d&c  04/12/2012    Procedure: DILATATION AND CURETTAGE /HYSTEROSCOPY;  Surgeon: Levi Aland, MD;  Location: [facility];  Service: Gynecology;  Laterality: N/A;  . Ectopic pregnancy surgery      Family History  Problem Relation Age of Onset  . Cancer Other   . COPD Other   . Heart disease Other   . Hyperlipidemia Other   . Stroke Other   . Obesity Other     History  Substance Use Topics  . Smoking status: Never Smoker   . Smokeless tobacco: Never Used  . Alcohol Use: No    Allergies: No Known Allergies  Prescriptions prior to admission  Medication Sig Dispense Refill Last Dose  . azithromycin (ZITHROMAX) 250 MG tablet Use as directed 6 each 0   . chlorpheniramine-HYDROcodone (TUSSIONEX PENNKINETIC ER) 10-8 MG/5ML LQCR Take 5 mLs by mouth every 12 (twelve) hours as needed (cough). 115 mL 0   . dicyclomine (BENTYL) 20 MG tablet Take 1 tablet (20 mg total) by mouth 2 (two) times daily. 20 tablet 0   . lisinopril (PRINIVIL,ZESTRIL) 10 MG tablet Take 10 mg by mouth daily.   08/25/2012 at Unknown  . ondansetron (ZOFRAN) 4 MG tablet Take 1 tablet (4  mg total) by mouth every 6 (six) hours. 12 tablet 0    Results for orders placed or performed during the hospital encounter of 12/18/14 (from the past 48 hour(s))  Urinalysis, Routine w reflex microscopic     Status: Abnormal   Collection Time: 12/18/14 11:43 AM  Result Value Ref Range   Color, Urine RED (A) YELLOW    Comment: BIOCHEMICALS MAY BE AFFECTED BY COLOR   APPearance HAZY (A) CLEAR   Specific Gravity, Urine 1.025 1.005 - 1.030   pH 7.0 5.0 - 8.0   Glucose, UA >1000 (A) NEGATIVE mg/dL   Hgb urine dipstick LARGE (A) NEGATIVE   Bilirubin Urine NEGATIVE NEGATIVE   Ketones, ur 15 (A) NEGATIVE mg/dL   Protein, ur  409 (A) NEGATIVE mg/dL   Urobilinogen, UA 1.0 0.0 - 1.0 mg/dL   Nitrite POSITIVE (A) NEGATIVE   Leukocytes, UA TRACE (A) NEGATIVE  Urine microscopic-add on     Status: Abnormal   Collection Time: 12/18/14 11:43 AM  Result Value Ref Range   Squamous Epithelial / LPF RARE RARE   WBC, UA 0-2 <3 WBC/hpf   RBC / HPF TOO NUMEROUS TO COUNT <3 RBC/hpf   Bacteria, UA FEW (A) RARE  Pregnancy, urine POC     Status: None   Collection Time: 12/18/14 11:59 AM  Result Value Ref Range   Preg Test, Ur NEGATIVE NEGATIVE    Comment:        THE SENSITIVITY OF THIS METHODOLOGY IS >24 mIU/mL   CBC     Status: Abnormal   Collection Time: 12/18/14 12:50 PM  Result Value Ref Range   WBC 8.5 4.0 - 10.5 K/uL   RBC 4.23 3.87 - 5.11 MIL/uL   Hemoglobin 9.7 (L) 12.0 - 15.0 g/dL   HCT 81.1 (L) 91.4 - 78.2 %   MCV 72.8 (L) 78.0 - 100.0 fL   MCH 22.9 (L) 26.0 - 34.0 pg   MCHC 31.5 30.0 - 36.0 g/dL   RDW 95.6 21.3 - 08.6 %   Platelets 249 150 - 400 K/uL    Review of Systems  Constitutional: Positive for malaise/fatigue. Negative for fever, chills, weight loss and diaphoresis.  HENT: Negative for congestion and sore throat.   Eyes: Negative for blurred vision.  Respiratory: Positive for shortness of breath. Negative for cough, hemoptysis, sputum production and wheezing.   Cardiovascular: Negative for chest pain, palpitations and leg swelling.  Gastrointestinal: Negative for heartburn, nausea, vomiting, abdominal pain, diarrhea, constipation and blood in stool.  Genitourinary: Negative for dysuria and frequency.  Musculoskeletal: Negative for falls.  Neurological: Positive for dizziness and weakness. Negative for tremors, seizures, loss of consciousness and headaches.  Endo/Heme/Allergies:       Excessive vaginal bleeding    Physical Exam   Blood pressure 146/81, pulse 86, temperature 98.1 F (36.7 C), temperature source Oral, resp. rate 18, height  (1.727 m), weight 139.799 kg (308 lb 3.2 oz),  last menstrual period 11/17/2014.  Physical Exam  Nursing note and vitals reviewed. Constitutional: She is oriented to person, place, and time. She appears well-developed and well-nourished. No distress.  HENT:  Head: Normocephalic and atraumatic.  Eyes: Right eye exhibits no discharge. Left eye exhibits no discharge.  Neck: Normal range of motion. Neck supple. No thyromegaly present.  Cardiovascular: Normal rate, regular rhythm, normal heart sounds and intact distal pulses.  Exam reveals no gallop and no friction rub.   No murmur heard. Respiratory: Effort normal and breath sounds normal. No stridor.  No respiratory distress. She has no wheezes. She has no rales. She exhibits no tenderness.  GI: Soft. Bowel sounds are normal. She exhibits no distension and no mass. There is no tenderness. There is no rebound and no guarding.  Genitourinary:  Labia without lesions. Pooled blood collected with one swab during speculum examination. Cervix pink and with closed os. No tenderness of uterus or adnexa with manual examination.  Musculoskeletal: Normal range of motion. She exhibits no edema or tenderness.  Lymphadenopathy:    She has no cervical adenopathy.  Neurological: She is alert and oriented to person, place, and time.  Skin: Skin is warm and dry. No rash noted. She is not diaphoretic. No erythema. No pallor.    MAU Course  Procedures  None  MDM:  Will obtain CBC, Urinalysis, and Urine pregnancy. --> Hgb 9.7 Patient reports bleeding has improved since starting OCP's and Tranexamic Acid.   Urine culture pending> patient without urinary symptoms.   Assessment and Plan  A: Excessive vaginal bleeding P:  Instructed patient to take 2 OCP's daily for the next 5 days and to skip the placebo pills.   Instructed patient to continue taking Tranexamic Acid.  Continue taking OTC iron supplement.  Follow-up with PCP if bleeding does not resolve within next 5 days.             Return to MAU if  bleeding worsens.      Duane LopeJennifer I Rasch, NP 12/18/2014 2:47 PM

## 2014-12-18 NOTE — Discharge Instructions (Signed)
Abnormal Uterine Bleeding Abnormal uterine bleeding can affect women at various stages in life, including teenagers, women in their reproductive years, pregnant women, and women who have reached menopause. Several kinds of uterine bleeding are considered abnormal, including:  Bleeding or spotting between periods.   Bleeding after sexual intercourse.   Bleeding that is heavier or more than normal.   Periods that last longer than usual.  Bleeding after menopause.  Many cases of abnormal uterine bleeding are minor and simple to treat, while others are more serious. Any type of abnormal bleeding should be evaluated by your health care provider. Treatment will depend on the cause of the bleeding. HOME CARE INSTRUCTIONS Monitor your condition for any changes. The following actions may help to alleviate any discomfort you are experiencing:  Avoid the use of tampons and douches as directed by your health care provider.  Change your pads frequently. You should get regular pelvic exams and Pap tests. Keep all follow-up appointments for diagnostic tests as directed by your health care provider.  SEEK MEDICAL CARE IF:   Your bleeding lasts more than 1 week.   You feel dizzy at times.  SEEK IMMEDIATE MEDICAL CARE IF:   You pass out.   You are changing pads every 15 to 30 minutes.   You have abdominal pain.  You have a fever.   You become sweaty or weak.   You are passing large blood clots from the vagina.   You start to feel nauseous and vomit. MAKE SURE YOU:   Understand these instructions.  Will watch your condition.  Will get help right away if you are not doing well or get worse. Document Released: 08/25/2005 Document Revised: 08/30/2013 Document Reviewed: 03/24/2013 ExitCare Patient Information 2015 [employer]. This information is not intended to replace advice given to you by your health care provider. Make sure you discuss any questions you have with your  health care provider.  

## 2014-12-19 LAB — URINE CULTURE

## 2016-08-04 ENCOUNTER — Encounter (HOSPITAL_COMMUNITY): Payer: Self-pay | Admitting: *Deleted

## 2016-08-04 ENCOUNTER — Emergency Department (HOSPITAL_COMMUNITY)
Admission: EM | Admit: 2016-08-04 | Discharge: 2016-08-04 | Disposition: A | Discharge status: Home / Self Care | Payer: BLUE CROSS/BLUE SHIELD | Attending: Emergency Medicine | Admitting: Emergency Medicine

## 2016-08-04 ENCOUNTER — Emergency Department (HOSPITAL_COMMUNITY): Payer: BLUE CROSS/BLUE SHIELD

## 2016-08-04 DIAGNOSIS — M25511 Pain in right shoulder: Secondary | ICD-10-CM | POA: Diagnosis not present

## 2016-08-04 DIAGNOSIS — I10 Essential (primary) hypertension: Secondary | ICD-10-CM | POA: Diagnosis not present

## 2016-08-04 DIAGNOSIS — X501XXA Overexertion from prolonged static or awkward postures, initial encounter: Secondary | ICD-10-CM | POA: Diagnosis not present

## 2016-08-04 DIAGNOSIS — Y9384 Activity, sleeping: Secondary | ICD-10-CM | POA: Diagnosis not present

## 2016-08-04 DIAGNOSIS — Y9289 Other specified places as the place of occurrence of the external cause: Secondary | ICD-10-CM | POA: Diagnosis not present

## 2016-08-04 DIAGNOSIS — Y999 Unspecified external cause status: Secondary | ICD-10-CM | POA: Diagnosis not present

## 2016-08-04 LAB — POC URINE PREG, ED: PREG TEST UR: NEGATIVE

## 2016-08-04 MED ORDER — NAPROXEN 250 MG PO TABS
500.0000 mg | ORAL_TABLET | Freq: Once | ORAL | Status: AC
  Administered 2016-08-04: 500 mg via ORAL
  Filled 2016-08-04: qty 2

## 2016-08-04 MED ORDER — NAPROXEN 500 MG PO TABS
500.0000 mg | ORAL_TABLET | Freq: Two times a day (BID) | ORAL | 0 refills | Status: DC
Start: 2016-08-04 — End: 2018-01-29

## 2016-08-04 MED ORDER — TRAMADOL HCL 50 MG PO TABS: 50.0000 mg | ORAL_TABLET | Freq: Four times a day (QID) | ORAL | 0 refills | Status: DC | PRN

## 2016-08-04 NOTE — ED Triage Notes (Signed)
PT states slept on stomach last night, doesn't normally do that.  Woke up this am with r arm pain.  Worse with palpation and movement.

## 2016-08-04 NOTE — ED Notes (Signed)
Declined W/C at D/C and was escorted to lobby by RN. 

## 2016-08-04 NOTE — Discharge Instructions (Signed)
Please take Naprosyn twice daily for pain.  Continue applying ice for 10-20 minutes three times daily.  Follow up with orthopedics if your symptoms persist.  Return to the ED for sudden worsening pain, numbness, weakness, or any new or concerning symptoms.

## 2016-08-04 NOTE — ED Provider Notes (Signed)
[facility]-EMERGENCY DEPT Provider Note   CSN: [id number] Arrival date & time: 08/04/16  0749     History   Chief Complaint Chief Complaint  Patient presents with  . Arm Pain    HPI Lynn FeilJanisha T Pearson is a 40 y.o. female.  HPI Patient presents with sudden onset, sharp, constant right shoulder pain since she stretched this morning.  Patient states she stretched and experience this sharp pain.  Since then it is painful to move her arm.  No numbness, weakness, fever, chills, swelling, CP, SOB, dizziness, diaphoresis, or any other symptoms.  No medications PTA.  This has never happened before. She states she did sleep on her stomach with her arms stretched over her head last night. Past Medical History:  Diagnosis Date  . Acid reflux   . Blood transfusion without reported diagnosis   . Chest pain, atypical    negative ER reports in EPIC  . Hypertension   . Obesity, Class III, BMI 40-49.9 (morbid obesity) (HCC)   . Polycystic ovarian disease   . Prediabetes    dx several years ago    Patient Active Problem List   Diagnosis Date Noted  . Iron deficiency anemia due to chronic blood loss 07/06/2012  . HTN (hypertension) 07/06/2012  . Obesity 07/06/2012  . Pre-diabetes 07/06/2012    Past Surgical History:  Procedure Laterality Date  . DIAGNOSTIC LAPAROSCOPY    . ECTOPIC PREGNANCY SURGERY    . HYSTEROSCOPY W/D&C  04/12/2012   Procedure: DILATATION AND CURETTAGE /HYSTEROSCOPY;  Surgeon: Levi AlandMark E Anderson, MD;  Location: [facility];  Service: Gynecology;  Laterality: N/A;    OB History    Gravida Para Term Preterm AB Living   8 4 4   4 4    SAB TAB Ectopic Multiple Live Births   2 1 1            Home Medications    Prior to Admission medications   Medication Sig Start Date End Date Taking? Authorizing Provider  naproxen (NAPROSYN) 500 MG tablet Take 1 tablet (500 mg total) by mouth 2 (two) times daily. 08/04/16   Cheri FowlerKayla Breanda Greenlaw, PA-C  norgestrel-ethinyl estradiol  (LO/OVRAL,CRYSELLE) 0.3-30 MG-MCG tablet Take 1 tablet by mouth daily.    Historical Provider, MD    Family History Family History  Problem Relation Age of Onset  . Cancer Other   . COPD Other   . Heart disease Other   . Hyperlipidemia Other   . Stroke Other   . Obesity Other     Social History Social History  Substance Use Topics  . Smoking status: Never Smoker  . Smokeless tobacco: Never Used  . Alcohol use No     Allergies   Patient has no known allergies.   Review of Systems Review of Systems All other systems negative unless otherwise stated in HPI   Physical Exam Updated Vital Signs BP 158/97 (BP Location: Left Arm)   Pulse 78   Temp 98.3 F (36.8 C) (Oral)   Resp 18   Ht 5\' 8"  (1.727 m)   Wt 129.3 kg   LMP 04/16/2016   SpO2 98%   BMI 43.33 kg/m   Physical Exam  Constitutional: She is oriented to person, place, and time. She appears well-developed and well-nourished.  HENT:  Head: Normocephalic and atraumatic.  Right Ear: External ear normal.  Left Ear: External ear normal.  Eyes: Conjunctivae are normal. No scleral icterus.  Neck: No tracheal deviation present.  Cardiovascular:  Pulses:  Radial pulses are 2+ on the right side, and 2+ on the left side.  Pulmonary/Chest: Effort normal. No respiratory distress.  Abdominal: She exhibits no distension.  Musculoskeletal: Normal range of motion.  Right shoulder: TTP over biceps tendon insertion and acromion.  No bony tenderness.  No swelling, bruising, or color changes.  Decreased ROM due to pain.  Pain with passive ROM.   Neurological: She is alert and oriented to person, place, and time.  Strength and sensation intact throughout bilateral upper extremities.   Skin: Skin is warm and dry.  Psychiatric: She has a normal mood and affect. Her behavior is normal.     ED Treatments / Results  Labs (all labs ordered are listed, but only abnormal results are displayed) Labs Reviewed  POC URINE  PREG, ED    EKG  EKG Interpretation None       Radiology Dg Shoulder Right  Result Date: 08/04/2016 CLINICAL DATA:  RIGHT shoulder pain and stiffness EXAM: RIGHT SHOULDER - 2+ VIEW COMPARISON:  None. FINDINGS: Patient woke up this morning with right shoulder pain and stiffness after sleeping on it last night, no injury. Patient had difficulty with axillary view because of body habitus and arm pain. IMPRESSION: No fracture or dislocation. Electronically Signed   By: Genevive BiStewart  Edmunds M.D.   On: 08/04/2016 09:43    Procedures Procedures (including critical care time)  Medications Ordered in ED Medications  naproxen (NAPROSYN) tablet 500 mg (not administered)     Initial Impression / Assessment and Plan / ED Course  I have reviewed the triage vital signs and the nursing notes.  Pertinent labs & imaging results that were available during my care of the patient were reviewed by me and considered in my medical decision making (see chart for details).  Clinical Course    Patient presents with right shoulder pain after sleeping on her stomach with her arms raised over her head last night. She is neurovascularly intact. With tenderness over the anterior shoulder. Possible tendinitis first bursitis. Plain films negative for acute abnormalities. Naprosyn for pain. Follow-up with orthopedics. Return precautions discussed. Stable for discharge.   Final Clinical Impressions(s) / ED Diagnoses   Final diagnoses:  Acute pain of right shoulder    New Prescriptions New Prescriptions   NAPROXEN (NAPROSYN) 500 MG TABLET    Take 1 tablet (500 mg total) by mouth 2 (two) times daily.     Cheri FowlerKayla Ziyan Hillmer, PA-C 08/04/16 1017    Cheri FowlerKayla Emily Massar, PA-C 08/04/16 1018    Derwood KaplanAnkit Nanavati, MD 08/09/16 1038

## 2018-01-29 ENCOUNTER — Ambulatory Visit (INDEPENDENT_AMBULATORY_CARE_PROVIDER_SITE_OTHER): Payer: BLUE CROSS/BLUE SHIELD | Admitting: Podiatry

## 2018-01-29 ENCOUNTER — Encounter: Payer: Self-pay | Admitting: Podiatry

## 2018-01-29 ENCOUNTER — Ambulatory Visit (INDEPENDENT_AMBULATORY_CARE_PROVIDER_SITE_OTHER): Payer: BLUE CROSS/BLUE SHIELD

## 2018-01-29 DIAGNOSIS — E1149 Type 2 diabetes mellitus with other diabetic neurological complication: Secondary | ICD-10-CM

## 2018-01-29 DIAGNOSIS — E119 Type 2 diabetes mellitus without complications: Secondary | ICD-10-CM | POA: Diagnosis not present

## 2018-01-29 DIAGNOSIS — M779 Enthesopathy, unspecified: Secondary | ICD-10-CM

## 2018-01-29 DIAGNOSIS — M2020 Hallux rigidus, unspecified foot: Secondary | ICD-10-CM

## 2018-01-29 DIAGNOSIS — M205X9 Other deformities of toe(s) (acquired), unspecified foot: Secondary | ICD-10-CM

## 2018-01-29 NOTE — Progress Notes (Signed)
Subjective:    Patient ID: Lynn Pearson, female    DOB: 12/04/1975, 42 y.o.   MRN: [id number]  HPI 42 year old female presents the office today for diabetic foot evaluation as well as for pain to both her big toe joints is been ongoing for some time.  She denies any recent injury or trauma to her feet.  She is diabetic her last A1c was 9.1.  She does get some numbness and tingling to her feet but denies any claudication symptoms.  She said no recent treatment for her feet.  No ulceration.  No significant swelling that she has noticed.  She has no other concerns.  Review of Systems  All other systems reviewed and are negative.  Past Medical History:  Diagnosis Date  . Acid reflux   . Blood transfusion without reported diagnosis   . Chest pain, atypical    negative ER reports in EPIC  . Hypertension   . Obesity, Class III, BMI 40-49.9 (morbid obesity) (HCC)   . Polycystic ovarian disease   . Prediabetes    dx several years ago    Past Surgical History:  Procedure Laterality Date  . DIAGNOSTIC LAPAROSCOPY    . ECTOPIC PREGNANCY SURGERY    . HYSTEROSCOPY W/D&C  04/12/2012   Procedure: DILATATION AND CURETTAGE /HYSTEROSCOPY;  Surgeon: Levi Aland, MD;  Location: [facility];  Service: Gynecology;  Laterality: N/A;     Current Outpatient Medications:  .  cloNIDine (CATAPRES) 0.1 MG tablet, Take by mouth., Disp: , Rfl:  .  hydrochlorothiazide (HYDRODIURIL) 25 MG tablet, Take 25 mg by mouth daily., Disp: , Rfl:  .  labetalol (NORMODYNE) 200 MG tablet, Take by mouth., Disp: , Rfl:  .  liraglutide (VICTOZA) 18 MG/3ML SOPN, Inject into the skin., Disp: , Rfl:  .  loratadine (CLARITIN) 10 MG tablet, Take 10 mg by mouth daily., Disp: , Rfl:   No Known Allergies      Objective:   Physical Exam  General: AAO x3, NAD  Dermatological: Skin is warm, dry and supple bilateral. Nails x 10 are well manicured; remaining integument appears unremarkable at this time. There are no  open sores, no preulcerative lesions, no rash or signs of infection present.  Vascular: Dorsalis Pedis artery and Posterior Tibial artery pedal pulses are 2/4 bilateral with immedate capillary fill time. Pedal hair growth present. No varicosities and no lower extremity edema present bilateral. There is no pain with calf compression, swelling, warmth, erythema.   Neruologic: Grossly intact via light touch bilateral. Vibratory intact via tuning fork bilateral. Protective threshold with Semmes Wienstein monofilament intact to all pedal sites bilateral.   Musculoskeletal: Mild discomfort first MPJ range of motion bilaterally however no significant restriction except for end range of motion in dorsiflexion.  There is no area pinpoint bony tenderness or pain to vibratory sensation.  There is no overlying edema, erythema, increase in warmth.  Muscular strength 5/5 in all groups tested bilateral.  Gait: Unassisted, Nonantalgic.     Assessment & Plan:  42 year old female presents for diabetic foot evaluation, hallux limitus/capsulitis bilaterally -Treatment options discussed including all alternatives, risks, and complications -Etiology of symptoms were discussed -Regards her diabetes there is no open sores.  She has palpable pulses.  Likely early neuropathy given some numbness and tingling to her feet.  Continue to monitor closely for this for any worsening.  Discussed importance of daily foot inspection. -In regards to her foot pain x-rays were obtained and reviewed.  Arthritic changes present  the first MPJ although mild there is narrowing of the joint space instructed the spurs off the lateral MPJ.  No evidence of acute fracture identified.  Discussed importance of wearing supportive shoes as well as we discussed orthotics for shoes.  We also discussed wearing a stiffer soled shoe.  Vivi Barrack DPM

## 2018-02-05 ENCOUNTER — Telehealth: Payer: Self-pay | Admitting: Podiatry

## 2018-02-05 NOTE — Telephone Encounter (Signed)
Called pt with information regarding orthotic coverage. Per insurance they are not covered. Pt stated she did have a secondary but if primary does not cover secondary usually will not cover either.

## 2019-02-21 ENCOUNTER — Other Ambulatory Visit: Payer: Self-pay

## 2019-02-21 ENCOUNTER — Encounter: Payer: Self-pay | Admitting: Internal Medicine

## 2019-02-21 ENCOUNTER — Ambulatory Visit (INDEPENDENT_AMBULATORY_CARE_PROVIDER_SITE_OTHER): Payer: BLUE CROSS/BLUE SHIELD | Admitting: Internal Medicine

## 2019-02-21 DIAGNOSIS — R809 Proteinuria, unspecified: Secondary | ICD-10-CM

## 2019-02-21 DIAGNOSIS — E1129 Type 2 diabetes mellitus with other diabetic kidney complication: Secondary | ICD-10-CM

## 2019-02-21 HISTORY — DX: Type 2 diabetes mellitus with other diabetic kidney complication: E11.29

## 2019-02-21 MED ORDER — VICTOZA 18 MG/3ML ~~LOC~~ SOPN: PEN_INJECTOR | SUBCUTANEOUS | 3 refills | Status: AC

## 2019-02-21 MED ORDER — METFORMIN HCL 500 MG PO TABS: 500.0000 mg | ORAL_TABLET | Freq: Two times a day (BID) | ORAL | 3 refills | Status: AC

## 2019-02-21 NOTE — Progress Notes (Addendum)
Patient ID: Lynn Pearson, female   DOB: Jul 15, 1976, 43 y.o.   MRN: [id number]   Patient location: Home My location: Office  I connected with the patient on 03/02/19 at  8:15 AM EDT by a video enabled telemedicine application and verified that I am speaking with the correct person.   I discussed the limitations of evaluation and management by telemedicine and the availability of in person appointments. The patient expressed understanding and agreed to proceed.   Details of the encounter are shown below.  HPI: Lynn Pearson is a 43 y.o.-year-old female, referred by Dr. Gilman Schmidt, for management of DM2, dx in 2016, non-insulin-dependent, uncontrolled, with albuminuria.  Reviewed latest HbA1c level: 01/19/2019: HbA1c 11.4% 07/03/2018: HbA1c 10.3% 01/26/2018: HbA1c 9.1% No results found for: HGBA1C  Since last HbA1c, she improved her diet >> cut down starches.  Her sugars improved radically.  Pt is on a regimen of: - Victoza 1.8 mg in a.m. She was on Xigduo XR (dapagliflozin-Metformin XR) 01-999 mg in am.  Pt checks her sugars 2-3x a day and they are: - am: 150 >> 95-120 - 2h after b'fast: n/c - before lunch: 120-140 - 2h after lunch: n/c - before dinner: n/c - 2h after dinner: 140 - bedtime: n/c - nighttime: n/c Lowest sugar was 95; she has hypoglycemia awareness at 70.  Highest sugar was 311.  Glucometer: [place]  Pt's meals are - now Weight Watchers: - Breakfast: protein smoothies or omelet or omelet or fruit - Lunch: heaviest meal - sandwich or TV dinner, fruit or nut snack - Dinner: veggies +/- meat - Snacks: fruit  - no CKD, last creatinine:  01/19/2019: Glu 311, creatinine 0.8, GFR >60 Lab Results  Component Value Date   BUN 8 12/06/2013   BUN 7 07/06/2012   CREATININE 0.70 12/06/2013   CREATININE 0.80 07/06/2012  However, she has albuminuria: 01/19/2019: ACR 302 07/03/2018: ACR 17.1 11/24/2017: ACR 11.8 04/28/2017: ACR 31.5  On losartan  100.  -+ HL; last set of lipids: 01/19/2019: 183/223/25/117 07/03/2018: 218/171/33/151 No results found for: CHOL, HDL, LDLCALC, LDLDIRECT, TRIG, CHOLHDL On pravastatin 20.  - last eye exam was in 2019. No DR.   - no numbness and tingling in her feet. Has a podiatrist.  Pt has FH of DM in M aunt, M GM.  ROS: Constitutional: no weight gain, + weight loss - 5 lbs, no fatigue, no subjective hyperthermia, no subjective hypothermia, no nocturia Eyes: no blurry vision, no xerophthalmia ENT: no sore throat, no nodules palpated in neck, no dysphagia, no odynophagia, no hoarseness, no tinnitus, no hypoacusis Cardiovascular: no CP, no SOB, no palpitations, no leg swelling Respiratory: no cough, no SOB, no wheezing Gastrointestinal: no N, no V, no D, no C, no acid reflux Musculoskeletal: + muscle - back and neck, no joint aches Skin: no rash, no hair loss Neurological: no tremors, no numbness or tingling/no dizziness/+ HAs Psychiatric: no depression, no anxiety  Past Medical History:  Diagnosis Date  . Acid reflux   . Blood transfusion without reported diagnosis   . Chest pain, atypical    negative ER reports in [facility]  . Hypertension   . Obesity, Class III, BMI 40-49.9 (morbid obesity) ([place])   . Polycystic ovarian disease   . Prediabetes    dx several years ago   Past Surgical History:  Procedure Laterality Date  . DIAGNOSTIC LAPAROSCOPY    . ECTOPIC PREGNANCY SURGERY    . HYSTEROSCOPY W/D&C  04/12/2012   Procedure: DILATATION AND CURETTAGE /  HYSTEROSCOPY;  Surgeon: Levi AlandMark E Anderson, MD;  Location: [facility];  Service: Gynecology;  Laterality: N/A;   Social History   Socioeconomic History  . Marital status: Divorced    Spouse name: Not on file  . Number of children: Not on file  . Years of education: Not on file  . Highest education level: Not on file  Occupational History  . Not on file  Social Needs  . Financial resource strain: Not on file  . Food insecurity    Worry: Not on  file    Inability: Not on file  . Transportation needs    Medical: Not on file    Non-medical: Not on file  Tobacco Use  . Smoking status: Never Smoker  . Smokeless tobacco: Never Used  Substance and Sexual Activity  . Alcohol use: No  . Drug use: No  . Sexual activity: Yes    Birth control/protection: None, Pill  Lifestyle  . Physical activity    Days per week: Not on file    Minutes per session: Not on file  . Stress: Not on file  Relationships  . Social Musicianconnections    Talks on phone: Not on file    Gets together: Not on file    Attends religious service: Not on file    Active member of club or organization: Not on file    Attends meetings of clubs or organizations: Not on file    Relationship status: Not on file  . Intimate partner violence    Fear of current or ex partner: Not on file    Emotionally abused: Not on file    Physically abused: Not on file    Forced sexual activity: Not on file  Other Topics Concern  . Not on file  Social History Narrative  . Not on file   Current Outpatient Medications on File Prior to Visit  Medication Sig Dispense Refill  . butalbital-acetaminophen-caffeine (FIORICET) 50-325-40 MG tablet Take by mouth.    . cloNIDine (CATAPRES) 0.1 MG tablet Take by mouth.    . hydrochlorothiazide (HYDRODIURIL) 25 MG tablet Take 25 mg by mouth daily.    . hydrOXYzine (ATARAX/VISTARIL) 10 MG tablet Take by mouth.    . labetalol (NORMODYNE) 200 MG tablet Take by mouth.    . liraglutide (VICTOZA) 18 MG/3ML SOPN Inject into the skin.    Marland Kitchen. loratadine (CLARITIN) 10 MG tablet Take 10 mg by mouth daily.    Marland Kitchen. amLODipine (NORVASC) 10 MG tablet     . [DISCONTINUED] ferrous sulfate 325 (65 FE) MG tablet Take 1 tablet (325 mg total) by mouth 2 (two) times daily. 60 tablet 0  . [DISCONTINUED] medroxyPROGESTERone (PROVERA) 5 MG tablet Take 5 mg by mouth daily.     No current facility-administered medications on file prior to visit.    No Known Allergies Family  History  Problem Relation Age of Onset  . Cancer Other   . COPD Other   . Heart disease Other   . Hyperlipidemia Other   . Stroke Other   . Obesity Other     PE: There were no vitals taken for this visit. Wt Readings from Last 3 Encounters:  08/04/16 285 lb (129.3 kg)  12/18/14 (!) 308 lb 3.2 oz (139.8 kg)  12/06/13 275 lb (124.7 kg)   Constitutional:  in NAD  The physical exam was not performed (virtual visit).  ASSESSMENT: 1. DM2, non-insulin-dependent, uncontrolled, without long-term complications, but with hyperglycemia and albuminuria  PLAN:  1.  Patient with long-standing, uncontrolled diabetes, on daily GLP-1 receptor agonist, with much improved control after she started to improve her diet following her latest HbA1c, which returned very high, at 11.4% a month ago.  She lost 5 pounds since then on weight watchers and her sugars are now controlled and almost all at goal.  Her highest sugars are before lunch.  - I congratulated her about the changes that she is still eating her diet and strongly advised her to continue.  We also discussed about healthy snacks including fruit.  - I suggested to continue Victoza for now, but I do not feel that she needs back the SGLT 2 inhibitor.  We will try to add metformin, which she tolerated well in the past, at a decreased dose, 500 mg twice a day, to hopefully improve her sugars even more.  She agrees to try this. - I refilled Victoza for her - I suggested to:  Patient Instructions  Please continue: - Victoza 1.8 mg in a.m.  Start: - Metformin 500 mg 2x a day with meals  Please let me know if the sugars are consistently <80 or >200.  Please return in 2 months with your sugar log.   - Strongly advised her to start checking sugars at different times of the day - check 2x a day, rotating checks - discussed about CBG targets for treatment: 80-130 mg/dL before meals and <284<180 mg/dL after meals; target [id number] <7%. - given sugar log and  advised how to fill it and to bring it at next appt  - given foot care handout and explained the principles  - given instructions for hypoglycemia management "15-15 rule"  - advised for yearly eye exams  - Return to clinic in 2 mo with sugar log   Carlus Pavlovristina Jabre Heo, MD PhD [facility]

## 2019-02-21 NOTE — Patient Instructions (Addendum)
Please continue: - Victoza 1.8 mg in a.m.  Start: - Metformin 500 mg 2x a day with meals  Please let me know if the sugars are consistently <80 or >200.  Please return in 2 months with your sugar log.   PATIENT INSTRUCTIONS FOR TYPE 2 DIABETES:  DIET AND EXERCISE Diet and exercise is an important part of diabetic treatment.  We recommended aerobic exercise in the form of brisk walking (working between 40-60% of maximal aerobic capacity, similar to brisk walking) for 150 minutes per week (such as 30 minutes five days per week) along with 3 times per week performing 'resistance' training (using various gauge rubber tubes with handles) 5-10 exercises involving the major muscle groups (upper body, lower body and core) performing 10-15 repetitions (or near fatigue) each exercise. Start at half the above goal but build slowly to reach the above goals. If limited by weight, joint pain, or disability, we recommend daily walking in a swimming pool with water up to waist to reduce pressure from joints while allow for adequate exercise.    BLOOD GLUCOSES Monitoring your blood glucoses is important for continued management of your diabetes. Please check your blood glucoses 2-4 times a day: fasting, before meals and at bedtime (you can rotate these measurements - e.g. one day check before the 3 meals, the next day check before 2 of the meals and before bedtime, etc.).   HYPOGLYCEMIA (low blood sugar) Hypoglycemia is usually a reaction to not eating, exercising, or taking too much insulin/ other diabetes drugs.  Symptoms include tremors, sweating, hunger, confusion, headache, etc. Treat IMMEDIATELY with 15 grams of Carbs: . 4 glucose tablets .  cup regular juice/soda . 2 tablespoons raisins . 4 teaspoons sugar . 1 tablespoon honey Recheck blood glucose in 15 mins and repeat above if still symptomatic/blood glucose <100.  RECOMMENDATIONS TO REDUCE YOUR RISK OF DIABETIC COMPLICATIONS: * Take your  prescribed MEDICATION(S) * Follow a DIABETIC diet: Complex carbs, fiber rich foods, (monounsaturated and polyunsaturated) fats * AVOID saturated/trans fats, high fat foods, >2,300 mg salt per day. * EXERCISE at least 5 times a week for 30 minutes or preferably daily.  * DO NOT SMOKE OR DRINK more than 1 drink a day. * Check your FEET every day. Do not wear tightfitting shoes. Contact us if you develop an ulcer * See your EYE doctor once a year or more if needed * Get a FLU shot once a year * Get a PNEUMONIA vaccine once before and once after age 10865 years  GOALS:  * Your Hemoglobin A1c of <7%  * fasting sugars need to be <130 * after meals sugars need to be <180 (2h after you start eating) * Your Systolic BP should be 140 or lower  * Your Diastolic BP should be 80 or lower  * Your HDL (Good Cholesterol) should be 40 or higher  * Your LDL (Bad Cholesterol) should be 100 or lower. * Your Triglycerides should be 150 or lower  * Your Urine microalbumin (kidney function) should be <30 * Your Body Mass Index should be 25 or lower    Please consider the following ways to cut down carbs and fat and increase fiber and micronutrients in your diet: - substitute whole grain for white bread or pasta - substitute brown rice for white rice - substitute 90-calorie flat bread pieces for slices of bread when possible - substitute sweet potatoes or yams for white potatoes - substitute humus for margarine - substitute tofu for  cheese when possible - substitute almond or rice milk for regular milk (would not drink soy milk daily due to concern for soy estrogen influence on breast cancer risk) - substitute dark chocolate for other sweets when possible - substitute water - can add lemon or orange slices for taste - for diet sodas (artificial sweeteners will trick your body that you can eat sweets without getting calories and will lead you to overeating and weight gain in the long run) - do not skip  breakfast or other meals (this will slow down the metabolism and will result in more weight gain over time)  - can try smoothies made from fruit and almond/rice milk in am instead of regular breakfast - can also try old-fashioned (not instant) oatmeal made with almond/rice milk in am - order the dressing on the side when eating salad at a restaurant (pour less than half of the dressing on the salad) - eat as little meat as possible - can try juicing, but should not forget that juicing will get rid of the fiber, so would alternate with eating raw veg./fruits or drinking smoothies - use as little oil as possible, even when using olive oil - can dress a salad with a mix of balsamic vinegar and lemon juice, for e.g. - use agave nectar, stevia sugar, or regular sugar rather than artificial sweateners - steam or broil/roast veggies  - snack on veggies/fruit/nuts (unsalted, preferably) when possible, rather than processed foods - reduce or eliminate aspartame in diet (it is in diet sodas, chewing gum, etc) Read the labels!  Try to read Dr. Janene Harvey book: "Program for Reversing Diabetes" for other ideas for healthy eating.

## 2019-05-02 ENCOUNTER — Ambulatory Visit: Payer: BLUE CROSS/BLUE SHIELD | Admitting: Internal Medicine

## 2019-06-30 ENCOUNTER — Ambulatory Visit (INDEPENDENT_AMBULATORY_CARE_PROVIDER_SITE_OTHER): Payer: BLUE CROSS/BLUE SHIELD | Admitting: Internal Medicine

## 2019-06-30 DIAGNOSIS — Z5329 Procedure and treatment not carried out because of patient's decision for other reasons: Secondary | ICD-10-CM

## 2019-06-30 NOTE — Progress Notes (Signed)
Patient ID: Lynn Pearson, female   DOB: [birth year], 43 y.o.   MRN: [id number]   HPI: Lynn Pearson is a 43 y.o.-year-old female, initially referred by Dr. Alver FisherBoals, returning for follow-up DM2, dx in 2016, non-insulin-dependent, uncontrolled, with albuminuria.  Our first visit was in 02/2019 and this was a virtual visit.  Previous HbA1c levels: 01/19/2019: HbA1c 11.4% 07/03/2018: HbA1c 10.3% 01/26/2018: HbA1c 9.1% No results found for: HGBA1C  Since the previous HbA1c, she improved her diet and cut down starches and blood sugars improved dramatically.  Pt is on a regimen of: - Victoza 1.8 mg in a.m. - Metformin 500 mg twice a day-added 06/29 She was on Xigduo XR (dapagliflozin-Metformin XR) 01-999 mg in am.  Pt checks her sugars 2-3 times a day: - am: 150 >> 95-120 - 2h after b'fast: n/c - before lunch: 120-140 - 2h after lunch: n/c - before dinner: n/c - 2h after dinner: 140 - bedtime: n/c - nighttime: n/c Lowest sugar was 95 >> **; she has hypoglycemia awareness in the 70s.  Highest sugar was 311 >> **.  Glucometer: [place]  Pt's meals are -weight watchers: - Breakfast: protein smoothies or omelet or omelet or fruit - Lunch: heaviest meal - sandwich or TV dinner, fruit or nut snack - Dinner: veggies +/- meat - Snacks: fruit  -+ CKD, last creatinine:  01/19/2019: Glu 311, creatinine 0.8, GFR >60 Lab Results  Component Value Date   BUN 8 12/06/2013   BUN 7 07/06/2012   CREATININE 0.70 12/06/2013   CREATININE 0.80 07/06/2012   She has albuminuria: 01/19/2019: ACR 302 07/03/2018: ACR 17.1 11/24/2017: ACR 11.8 04/28/2017: ACR 31.5  On losartan 1.  -+ HL; last set of lipids: 01/19/2019: 183/223/25/117 07/03/2018: 218/171/33/151 No results found for: CHOL, HDL, LDLCALC, LDLDIRECT, TRIG, CHOLHDL On pravastatin 20.  - last eye exam was in 2019: No DR  - no numbness and tingling in her feet. Has a podiatrist.  Pt has FH of DM in M aunt, M  GM.  ROS: Constitutional: no weight gain/no weight loss, no fatigue, no subjective hyperthermia, no subjective hypothermia Eyes: no blurry vision, no xerophthalmia ENT: no sore throat, no nodules palpated in neck, no dysphagia, no odynophagia, no hoarseness Cardiovascular: no CP/no SOB/no palpitations/no leg swelling Respiratory: no cough/no SOB/no wheezing Gastrointestinal: no N/no V/no D/no C/no acid reflux Musculoskeletal: no muscle aches/no joint aches Skin: no rashes, no hair loss Neurological: no tremors/no numbness/no tingling/no dizziness  I reviewed pt's medications, allergies, PMH, social hx, family hx, and changes were documented in the history of present illness. Otherwise, unchanged from my initial visit note.  Past Medical History:  Diagnosis Date  . Acid reflux   . Blood transfusion without reported diagnosis   . Chest pain, atypical    negative ER reports in [facility]IC  . Hypertension   . Obesity, Class III, BMI 40-49.9 (morbid obesity) ([facility])   . Polycystic ovarian disease   . Prediabetes    dx several years ago   Past Surgical History:  Procedure Laterality Date  . DIAGNOSTIC LAPAROSCOPY    . ECTOPIC PREGNANCY SURGERY    . HYSTEROSCOPY W/D&C  04/12/2012   Procedure: DILATATION AND CURETTAGE /HYSTEROSCOPY;  Surgeon: Levi AlandMark E Anderson, MD;  Location: [facility];  Service: Gynecology;  Laterality: N/A;   Social History   Socioeconomic History  . Marital status: Divorced    Spouse name: Not on file  . Number of children: Not on file  . Years of education: Not on file  .  Highest education level: Not on file  Occupational History  . Not on file  Social Needs  . Financial resource strain: Not on file  . Food insecurity    Worry: Not on file    Inability: Not on file  . Transportation needs    Medical: Not on file    Non-medical: Not on file  Tobacco Use  . Smoking status: Never Smoker  . Smokeless tobacco: Never Used  Substance and Sexual Activity  . Alcohol use:  No  . Drug use: No  . Sexual activity: Yes    Birth control/protection: None, Pill  Lifestyle  . Physical activity    Days per week: Not on file    Minutes per session: Not on file  . Stress: Not on file  Relationships  . Social Herbalist on phone: Not on file    Gets together: Not on file    Attends religious service: Not on file    Active member of club or organization: Not on file    Attends meetings of clubs or organizations: Not on file    Relationship status: Not on file  . Intimate partner violence    Fear of current or ex partner: Not on file    Emotionally abused: Not on file    Physically abused: Not on file    Forced sexual activity: Not on file  Other Topics Concern  . Not on file  Social History Narrative  . Not on file   Current Outpatient Medications on File Prior to Visit  Medication Sig Dispense Refill  . amLODipine (NORVASC) 10 MG tablet     . butalbital-acetaminophen-caffeine (FIORICET) 50-325-40 MG tablet Take by mouth.    . cloNIDine (CATAPRES) 0.1 MG tablet Take by mouth.    . hydrochlorothiazide (HYDRODIURIL) 25 MG tablet Take 25 mg by mouth daily.    . hydrOXYzine (ATARAX/VISTARIL) 10 MG tablet Take by mouth.    . labetalol (NORMODYNE) 200 MG tablet Take by mouth.    . liraglutide (VICTOZA) 18 MG/3ML SOPN Inject under skin 1.8 mg before b'fast 9 mL 3  . loratadine (CLARITIN) 10 MG tablet Take 10 mg by mouth daily.    . metFORMIN (GLUCOPHAGE) 500 MG tablet Take 1 tablet (500 mg total) by mouth 2 (two) times daily with a meal. 180 tablet 3  . [DISCONTINUED] ferrous sulfate 325 (65 FE) MG tablet Take 1 tablet (325 mg total) by mouth 2 (two) times daily. 60 tablet 0  . [DISCONTINUED] medroxyPROGESTERone (PROVERA) 5 MG tablet Take 5 mg by mouth daily.     No current facility-administered medications on file prior to visit.    No Known Allergies Family History  Problem Relation Age of Onset  . Cancer Other   . COPD Other   . Heart disease  Other   . Hyperlipidemia Other   . Stroke Other   . Obesity Other     PE: There were no vitals taken for this visit. Wt Readings from Last 3 Encounters:  08/04/16 285 lb (129.3 kg)  12/18/14 (!) 308 lb 3.2 oz (139.8 kg)  12/06/13 275 lb (124.7 kg)   Constitutional: overweight, in NAD Eyes: PERRLA, EOMI, no exophthalmos ENT: moist mucous membranes, no thyromegaly, no cervical lymphadenopathy Cardiovascular: RRR, No MRG Respiratory: CTA B Gastrointestinal: abdomen soft, NT, ND, BS+ Musculoskeletal: no deformities, strength intact in all 4 Skin: moist, warm, no rashes Neurological: no tremor with outstretched hands, DTR normal in all 4  ASSESSMENT: 1.  DM2, non-insulin-dependent, uncontrolled, without long-term complications, but with hyperglycemia and microalbuminuria  2. HL  3. Obesity class 3  PLAN:  1. Patient with longstanding, uncontrolled, type 2 diabetes, on daily GLP-1 agonist, to which we added Metformin at last visit.  At that time, sugars improved significantly after she started to improve her diet following a very high HbA1c of 11.1%.  She lost weight and sugars are almost all at goal.  They were high as before lunch.  At that time we discussed about continuing with her diet and we also discussed about healthy snacks including fruit.  We also discussed about the possibility of adding an SGLT2 inhibitor but we did not start it yet.  We did start a low-dose Metformin which he tolerated well in the past.  - I suggested to:  Patient Instructions  Please continue: - Metformin 500 mg 2x a day with meals - Victoza 1.8 mg in a.m.  Please return in 3 months with your sugar log.   - we checked her HbA1c: 7%  - advised to check sugars at different times of the day - 1-2x a day, rotating check times - advised for yearly eye exams >> she is UTD - return to clinic in 3 months   2. HL - Reviewed latest lipid panel from 01/19/2019: 183/223/25/117 - LDL and TG high, HDL low -  Continues pravastatin without side effects.  3. Obesity class 3 -Made adding Metformin at last visit (weight stabilizing long-term.  We will also Victoza which can help with weight loss. - since last visit, she lost ** lbs  Carlus Pavlov, MD PhD [facility]

## 2019-06-30 NOTE — Patient Instructions (Signed)
Please continue: - Metformin 500 mg 2x a day with meals - Victoza 1.8 mg in a.m.  Please return in 3 months with your sugar log.

## 2019-07-15 ENCOUNTER — Encounter: Payer: Self-pay | Admitting: Internal Medicine

## 2022-09-20 ENCOUNTER — Emergency Department (HOSPITAL_BASED_OUTPATIENT_CLINIC_OR_DEPARTMENT_OTHER)
Admission: EM | Admit: 2022-09-20 | Discharge: 2022-09-21 | Disposition: A | Discharge status: Home / Self Care | Payer: Medicaid Other | Attending: Emergency Medicine | Admitting: Emergency Medicine

## 2022-09-20 ENCOUNTER — Other Ambulatory Visit: Payer: Self-pay

## 2022-09-20 ENCOUNTER — Encounter (HOSPITAL_BASED_OUTPATIENT_CLINIC_OR_DEPARTMENT_OTHER): Payer: Self-pay | Admitting: Emergency Medicine

## 2022-09-20 ENCOUNTER — Emergency Department (HOSPITAL_BASED_OUTPATIENT_CLINIC_OR_DEPARTMENT_OTHER): Payer: Medicaid Other

## 2022-09-20 DIAGNOSIS — R519 Headache, unspecified: Secondary | ICD-10-CM | POA: Diagnosis not present

## 2022-09-20 DIAGNOSIS — E119 Type 2 diabetes mellitus without complications: Secondary | ICD-10-CM | POA: Insufficient documentation

## 2022-09-20 DIAGNOSIS — Z79899 Other long term (current) drug therapy: Secondary | ICD-10-CM | POA: Diagnosis not present

## 2022-09-20 DIAGNOSIS — M546 Pain in thoracic spine: Secondary | ICD-10-CM | POA: Insufficient documentation

## 2022-09-20 DIAGNOSIS — Z7984 Long term (current) use of oral hypoglycemic drugs: Secondary | ICD-10-CM | POA: Insufficient documentation

## 2022-09-20 DIAGNOSIS — Y92410 Unspecified street and highway as the place of occurrence of the external cause: Secondary | ICD-10-CM | POA: Diagnosis not present

## 2022-09-20 DIAGNOSIS — I10 Essential (primary) hypertension: Secondary | ICD-10-CM | POA: Diagnosis not present

## 2022-09-20 MED ORDER — NAPROXEN 375 MG PO TABS: 375.0000 mg | ORAL_TABLET | Freq: Two times a day (BID) | ORAL | 0 refills | Status: AC

## 2022-09-20 MED ORDER — CYCLOBENZAPRINE HCL 10 MG PO TABS: 10.0000 mg | ORAL_TABLET | Freq: Two times a day (BID) | ORAL | 0 refills | Status: AC | PRN

## 2022-09-20 MED ORDER — DIAZEPAM 5 MG PO TABS
5.0000 mg | ORAL_TABLET | Freq: Once | ORAL | Status: AC
  Administered 2022-09-20: 5 mg via ORAL
  Filled 2022-09-20: qty 1
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
## 2022-09-20 NOTE — Discharge Instructions (Addendum)
I have prescribed muscle relaxers for your pain, please do not drink or drive while taking this medications as it can make you drowsy.      Please follow-up with PCP in 1 week for reevaluation of your symptoms.I
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
## 2023-02-28 ENCOUNTER — Ambulatory Visit
Admission: EM | Admit: 2023-02-28 | Discharge: 2023-02-28 | Disposition: A | Discharge status: Home / Self Care | Payer: Managed Care, Other (non HMO) | Attending: Urgent Care | Admitting: Urgent Care

## 2023-02-28 DIAGNOSIS — R300 Dysuria: Secondary | ICD-10-CM | POA: Diagnosis present

## 2023-02-28 DIAGNOSIS — N3001 Acute cystitis with hematuria: Secondary | ICD-10-CM

## 2023-02-28 LAB — POCT URINALYSIS DIP (MANUAL ENTRY)
Bilirubin, UA: NEGATIVE
Glucose, UA: >=1000 mg/dL — AB
Nitrite, UA: NEGATIVE
Protein Ur, POC: NEGATIVE mg/dL
Spec Grav, UA: 1.015 (ref 1.010–1.025)
Urobilinogen, UA: 0.2 E.U./dL
pH, UA: 7 (ref 5.0–8.0)

## 2023-02-28 MED ORDER — CEPHALEXIN 500 MG PO CAPS: 500.0000 mg | ORAL_CAPSULE | Freq: Two times a day (BID) | ORAL | 0 refills | Status: DC

## 2023-02-28 NOTE — Discharge Instructions (Signed)
Please start cephalexin to address an urinary tract infection. Make sure you hydrate very well with plain water and a quantity of 80 ounces of water a day.  Please limit drinks that are considered urinary irritants such as soda, sweet tea, coffee, energy drinks, alcohol.  These can worsen your urinary and genital symptoms but also be the source of them.  I will let you know about your urine culture results through MyChart to see if we need to prescribe or change your antibiotics based off of those results.  

## 2023-02-28 NOTE — ED Triage Notes (Signed)
Pt c/o dysuria x 3-4 days-NAD-steady gait

## 2023-02-28 NOTE — ED Provider Notes (Signed)
[facility]  Note:  This document was prepared using Conservation officer, historic buildings and may include unintentional dictation errors.  MRN: [id number] DOB: 1976-04-10  Subjective:   Lynn Pearson is a 47 y.o. female presenting for 4 day history of dysuria, urinary frequency and urgency. Denies fever, n/v, abdominal pain, pelvic pain, rashes, vaginal discharge.  Hydrates well with plain.   No current facility-administered medications for this encounter.  Current Outpatient Medications:    amLODipine (NORVASC) 10 MG tablet, , Disp: , Rfl:    cloNIDine (CATAPRES) 0.1 MG tablet, Take by mouth., Disp: , Rfl:    hydrochlorothiazide (HYDRODIURIL) 25 MG tablet, Take 25 mg by mouth daily., Disp: , Rfl:    hydrOXYzine (ATARAX/VISTARIL) 10 MG tablet, Take by mouth., Disp: , Rfl:    labetalol (NORMODYNE) 200 MG tablet, Take by mouth., Disp: , Rfl:    liraglutide (VICTOZA) 18 MG/3ML SOPN, Inject under skin 1.8 mg before b'fast, Disp: 9 mL, Rfl: 3   loratadine (CLARITIN) 10 MG tablet, Take 10 mg by mouth daily., Disp: , Rfl:    metFORMIN (GLUCOPHAGE) 500 MG tablet, Take 1 tablet (500 mg total) by mouth 2 (two) times daily with a meal., Disp: 180 tablet, Rfl: 3   No Known Allergies  Past Medical History:  Diagnosis Date   Acid reflux    Blood transfusion without reported diagnosis    Chest pain, atypical    negative ER reports in EPIC   Hypertension    Obesity, Class III, BMI 40-49.9 (morbid obesity) (HCC)    Polycystic ovarian disease    Prediabetes    dx several years ago   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     Past Surgical History:  Procedure Laterality Date   DIAGNOSTIC LAPAROSCOPY     ECTOPIC PREGNANCY SURGERY     HYSTEROSCOPY WITH D & C  04/12/2012   Procedure: DILATATION AND CURETTAGE /HYSTEROSCOPY;  Surgeon: Levi Aland, MD;  Location: [facility];  Service: Gynecology;  Laterality: N/A;    Family History  Problem  Relation Age of Onset   Cancer Other    COPD Other    Heart disease Other    Hyperlipidemia Other    Stroke Other    Obesity Other     Social History   Tobacco Use   Smoking status: Never   Smokeless tobacco: Never  Vaping Use   Vaping Use: Never used  Substance Use Topics   Alcohol use: No   Drug use: No    ROS   Objective:   Vitals: BP (!) 141/97 (BP Location: Right Arm)   Pulse 97   Temp 98.4 F (36.9 C) (Oral)   Resp 20   LMP  (LMP Unknown) Comment: hx irreg pds due to PCOS per pt  SpO2 96%   Physical Exam Constitutional:      General: She is not in acute distress.    Appearance: Normal appearance. She is well-developed. She is not ill-appearing, toxic-appearing or diaphoretic.  HENT:     Head: Normocephalic and atraumatic.     Nose: Nose normal.     Mouth/Throat:     Mouth: Mucous membranes are moist.  Eyes:     General: No scleral icterus.       Right eye: No discharge.        Left eye: No discharge.     Extraocular Movements: Extraocular movements intact.     Conjunctiva/sclera: Conjunctivae normal.  Cardiovascular:  Rate and Rhythm: Normal rate.  Pulmonary:     Effort: Pulmonary effort is normal.  Abdominal:     General: Bowel sounds are normal. There is no distension.     Palpations: Abdomen is soft. There is no mass.     Tenderness: There is no abdominal tenderness. There is no right CVA tenderness, left CVA tenderness, guarding or rebound.  Skin:    General: Skin is warm and dry.  Neurological:     General: No focal deficit present.     Mental Status: She is alert and oriented to person, place, and time.  Psychiatric:        Mood and Affect: Mood normal.        Behavior: Behavior normal.        Thought Content: Thought content normal.        Judgment: Judgment normal.     Results for orders placed or performed during the hospital encounter of 02/28/23 (from the past 24 hour(s))  POCT urinalysis dipstick     Status: Abnormal    Collection Time: 02/28/23  8:29 AM  Result Value Ref Range   Color, UA yellow yellow   Clarity, UA cloudy (A) clear   Glucose, UA >=1,000 (A) negative mg/dL   Bilirubin, UA negative negative   Ketones, POC UA small (15) (A) negative mg/dL   Spec Grav, UA 1.610 9.604 - 1.025   Blood, UA trace-intact (A) negative   pH, UA 7.0 5.0 - 8.0   Protein Ur, POC negative negative mg/dL   Urobilinogen, UA 0.2 0.2 or 1.0 E.U./dL   Nitrite, UA Negative Negative   Leukocytes, UA Moderate (2+) (A) Negative    Assessment and Plan :   PDMP not reviewed this encounter.  1. Acute cystitis with hematuria   2. Dysuria    Start cephalexin to cover for acute cystitis, urine culture pending.  Recommended aggressive hydration, limiting urinary irritants. Patient does not normally get yeast infections but advised that she call the clinic for fluconazole if she gets symptoms over the next few days.  Counseled patient on potential for adverse effects with medications prescribed/recommended today, ER and return-to-clinic precautions discussed, patient verbalized understanding.    Wallis Bamberg, [place] 02/28/23 [phone]

## 2023-03-02 ENCOUNTER — Telehealth: Payer: Self-pay

## 2023-03-02 ENCOUNTER — Telehealth: Payer: Self-pay | Admitting: Nurse Practitioner

## 2023-03-02 DIAGNOSIS — N3001 Acute cystitis with hematuria: Secondary | ICD-10-CM

## 2023-03-02 LAB — URINE CULTURE: Culture: 60000 — AB

## 2023-03-02 MED ORDER — AMOXICILLIN 500 MG PO CAPS: 500.0000 mg | ORAL_CAPSULE | Freq: Three times a day (TID) | ORAL | 0 refills | Status: AC

## 2023-03-02 NOTE — Telephone Encounter (Signed)
Pt called requesting to have an alternative of Cephalexin prescribed due to the side effects she was experiencing. Jodi Mayer-APP sent in Amoxicillin as an alternative. Pt was advised to take a probiotic if she developed diarrhea as a side effect,, pt expressed understanding.

## 2023-03-02 NOTE — Telephone Encounter (Signed)
Patient was treated with Keflex for UTI.  Urine culture shows E. coli and group strep B.  States she is having nausea, diarrhea, and fatigue on Keflex and was requesting a different antibiotic.  Rx for amoxicillin sent to pharmacy.  Nursing staff to inform patient that these were side effects of antibiotics in general and she can take a probiotic if needed.

## 2023-11-24 ENCOUNTER — Encounter: Payer: Self-pay | Admitting: *Deleted

## 2023-11-24 ENCOUNTER — Ambulatory Visit
Admission: EM | Admit: 2023-11-24 | Discharge: 2023-11-24 | Disposition: A | Discharge status: Home / Self Care | Attending: Family Medicine | Admitting: Family Medicine

## 2023-11-24 DIAGNOSIS — H1011 Acute atopic conjunctivitis, right eye: Secondary | ICD-10-CM

## 2023-11-24 DIAGNOSIS — H6992 Unspecified Eustachian tube disorder, left ear: Secondary | ICD-10-CM | POA: Diagnosis not present

## 2023-11-24 MED ORDER — AZELASTINE HCL 0.05 % OP SOLN: 1.0000 [drp] | Freq: Two times a day (BID) | OPHTHALMIC | 0 refills | Status: AC

## 2023-11-24 MED ORDER — PREDNISONE 20 MG PO TABS: 40.0000 mg | ORAL_TABLET | Freq: Every day | ORAL | 0 refills | Status: AC

## 2023-11-24 MED ORDER — FLUTICASONE PROPIONATE 50 MCG/ACT NA SUSP: 1.0000 | Freq: Every day | NASAL | 0 refills | Status: AC

## 2023-11-24 NOTE — ED Triage Notes (Signed)
 Patient states left ear pain for over 1 week no drainage.  Right eye pain and tearing for 2 days.

## 2023-11-24 NOTE — ED Provider Notes (Signed)
 [facility]    CSN: [id number] Arrival date & time: 11/24/23  1723      History   Chief Complaint Chief Complaint  Patient presents with   Otalgia   Conjunctivitis    HPI Lynn Pearson is a 48 y.o. female presents for ear pain and eye redness.  Patient reports 1 week of right eye redness/watering with foreign body sensation and itching.  Denies any injury but does wear contacts and is concerned she may have scratched it.  No blurry or double vision, eye pain.  No URI symptoms.  No purulent drainage.  In addition she reports 1 week of left ear pain/ringing.  No fevers again URI symptoms, drainage from the ear or hearing changes.  She has not used any OTC medications for the symptoms.  No other concerns at this time.   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   Iron deficiency anemia due to chronic blood loss 07/06/2012   HTN (hypertension) 07/06/2012   Obesity 07/06/2012    Past Surgical History:  Procedure Laterality Date   DIAGNOSTIC LAPAROSCOPY     ECTOPIC PREGNANCY SURGERY     HYSTEROSCOPY WITH D & C  04/12/2012   Procedure: DILATATION AND CURETTAGE /HYSTEROSCOPY;  Surgeon: Levi Aland, MD;  Location: [facility];  Service: Gynecology;  Laterality: N/A;    OB History     Gravida  8   Para  4   Term  4   Preterm      AB  4   Living  4      SAB  2   IAB  1   Ectopic  1   Multiple      Live Births               Home Medications    Prior to Admission medications    Medication Sig Start Date End Date Taking? Authorizing Provider  azelastine (OPTIVAR) 0.05 % ophthalmic solution Place 1 drop into the right eye 2 (two) times daily for 7 days. 11/24/23 12/01/23 Yes Radford Pax, NP  fluticasone (FLONASE) 50 MCG/ACT nasal spray Place 1 spray into both nostrils daily. 11/24/23  Yes Radford Pax, NP  OZEMPIC, 0.25 OR 0.5 MG/DOSE, 2 MG/3ML SOPN Inject into the skin. 07/20/23  Yes [provider]  predniSONE (DELTASONE) 20 MG tablet Take 2 tablets (40 mg total) by mouth daily with breakfast for 5 days. 11/24/23 11/29/23 Yes Radford Pax, NP  amLODipine (NORVASC) 10 MG tablet  02/17/19   [provider]  buPROPion (WELLBUTRIN XL) 150 MG 24 hr tablet Take 150 mg by mouth every morning.    [provider]  cloNIDine (CATAPRES) 0.1 MG tablet Take by mouth. 01/26/18   [provider]  escitalopram (LEXAPRO) 10 MG tablet Take 10 mg by mouth daily.    [provider]  glipiZIDE (GLUCOTROL XL) 5 MG 24 hr tablet Take 5 mg by mouth  daily.    [provider]  hydrochlorothiazide (HYDRODIURIL) 25 MG tablet Take 25 mg by mouth daily.    [provider]  hydrOXYzine (ATARAX) 25 MG tablet Take 25 mg by mouth 3 (three) times daily as needed.    [provider]  hydrOXYzine (ATARAX/VISTARIL) 10 MG tablet Take by mouth. 02/17/19   [provider]  labetalol (NORMODYNE) 200 MG tablet Take by mouth. 01/19/17   [provider]  liraglutide (VICTOZA) 18 MG/3ML SOPN Inject under skin 1.8 mg before b'fast 02/21/19   Carlus Pavlov, MD  loratadine (CLARITIN) 10 MG tablet Take 10 mg by mouth daily.    [provider]  metFORMIN (GLUCOPHAGE) 500 MG tablet Take 1 tablet (500 mg total) by mouth 2 (two) times daily with a meal. 02/21/19   Carlus Pavlov, MD  Vitamin D, Ergocalciferol, (DRISDOL) 1.25 MG (50000 UNIT) CAPS capsule Take 50,000 Units by mouth once a week.    [provider]   ferrous sulfate 325 (65 FE) MG tablet Take 1 tablet (325 mg total) by mouth 2 (two) times daily. 07/07/12 06/23/13  Clydia Llano, MD  medroxyPROGESTERone (PROVERA) 5 MG tablet Take 5 mg by mouth daily.  06/23/13  [provider]    Family History Family History  Problem Relation Age of Onset   Cancer Other    COPD Other    Heart disease Other    Hyperlipidemia Other    Stroke Other    Obesity Other     Social History Social History   Tobacco Use   Smoking status: Never   Smokeless tobacco: Never  Vaping Use   Vaping status: Never Used  Substance Use Topics   Alcohol use: No   Drug use: No     Allergies   Patient has no known allergies.   Review of Systems Review of Systems  HENT:  Positive for ear pain.   Eyes:  Positive for discharge and redness.     Physical Exam Triage Vital Signs ED Triage Vitals  Encounter Vitals Group     BP 11/24/23 1751 (!) 146/84     Systolic BP Percentile --      Diastolic BP Percentile --      Pulse Rate 11/24/23 1751 88     Resp 11/24/23 1751 18     Temp 11/24/23 1751 98.8 F (37.1 C)     Temp Source 11/24/23 1751 Oral     SpO2 11/24/23 1751 96 %     Weight 11/24/23 1746 230 lb (104.3 kg)     Height 11/24/23 1746 5\' 9"  (1.753 m)     Head Circumference --      Peak Flow --      Pain Score 11/24/23 1745 7     Pain Loc --      Pain Education --      Exclude from Growth Chart --    No data found.  Updated Vital Signs BP (!) 146/84 (BP Location: Right Arm) Comment: checked twice  Pulse 88   Temp 98.8 F (37.1 C) (Oral)   Resp 18   Ht 5\' 9"  (1.753 m)   Wt 230 lb (104.3 kg)   LMP 10/22/2023   SpO2 96%   BMI 33.97 kg/m   Visual Acuity Right Eye Distance: 20/40 Left Eye Distance: 20/13 Bilateral Distance: 20/13  Right Eye Near:   Left Eye Near:    Bilateral Near:     Physical Exam Vitals and nursing note reviewed.  Constitutional:      General: She is not in acute distress.    Appearance: Normal  appearance. She is not ill-appearing.  HENT:     Head: Normocephalic and atraumatic.     Right Ear: Tympanic membrane and ear canal normal.     Left Ear: Ear canal normal. A middle ear effusion is present. Tympanic membrane is not erythematous.  Eyes:     General: Lids are normal.        Right eye: No foreign body, discharge or hordeolum.     Extraocular Movements: Extraocular movements intact.     Conjunctiva/sclera:     Right eye: Right conjunctiva is not injected. No chemosis, exudate or hemorrhage.    Pupils: Pupils are equal, round, and reactive to light.     Right eye: No corneal abrasion or fluorescein uptake.     Comments: Watery drainage from the right conjunctiva without erythema or injection  Cardiovascular:     Rate and Rhythm: Normal rate.  Pulmonary:     Effort: Pulmonary effort is normal.  Skin:    General: Skin is warm and dry.  Neurological:     General: No focal deficit present.     Mental Status: She is alert and oriented to person, place, and time.  Psychiatric:        Mood and Affect: Mood normal.        Behavior: Behavior normal.      UC Treatments / Results  Labs (all labs ordered are listed, but only abnormal results are displayed) Labs Reviewed - No data to display  EKG   Radiology No results found.  Procedures Procedures (including critical care time)  Medications Ordered in UC Medications - No data to display  Initial Impression / Assessment and Plan / UC Course  I have reviewed the triage vital signs and the nursing notes.  Pertinent labs & imaging results that were available during my care of the patient were reviewed by me and considered in my medical decision making (see chart for details).     Reviewed exam and symptoms with patient.  No red flags.  Negative fluorescein uptake/corneal abrasion.  Discussed likely allergic conjunctivitis.  Will do trial of Optivar antihistamine eyedrops.  Also discussed eustachian tube dysfunction  trial of Flonase and prednisone.  PCP follow-up if symptoms do not improve.  ER precautions reviewed and patient verbalized understanding. Final Clinical Impressions(s) / UC Diagnoses   Final diagnoses:  Eustachian tube dysfunction, left  Allergic conjunctivitis of right eye     Discharge Instructions      Start Flonase and prednisone daily.  He may use antihistamine eyedrops twice daily to the right eye as needed.  Follow-up with your PCP if your symptoms do not improve.  Please go to the ER for any worsening symptoms.  Hope you feel better soon!    ED Prescriptions     Medication Sig Dispense Auth. Provider   fluticasone (FLONASE) 50 MCG/ACT nasal spray Place 1 spray into both nostrils daily. 15.8 mL Radford Pax, NP   predniSONE (DELTASONE) 20 MG tablet Take 2 tablets (40 mg total) by mouth daily with breakfast for 5 days. 10 tablet Radford Pax, NP   azelastine (OPTIVAR) 0.05 % ophthalmic solution Place 1 drop into the right eye 2 (two) times daily for 7 days. 6 mL Radford Pax, NP      PDMP not reviewed this encounter.   Radford Pax, NP 11/24/23 [phone]

## 2023-11-24 NOTE — Discharge Instructions (Addendum)
 Start Flonase and prednisone daily.  He may use antihistamine eyedrops twice daily to the right eye as needed.  Follow-up with your PCP if your symptoms do not improve.  Please go to the ER for any worsening symptoms.  Hope you feel better soon!

## 2024-01-05 ENCOUNTER — Ambulatory Visit
Admission: EM | Admit: 2024-01-05 | Discharge: 2024-01-05 | Disposition: A | Discharge status: Home / Self Care | Attending: Family Medicine | Admitting: Family Medicine

## 2024-01-05 ENCOUNTER — Other Ambulatory Visit: Payer: Self-pay

## 2024-01-05 DIAGNOSIS — R21 Rash and other nonspecific skin eruption: Secondary | ICD-10-CM | POA: Diagnosis not present

## 2024-01-05 MED ORDER — TRIAMCINOLONE ACETONIDE 0.1 % EX CREA: 1.0000 | TOPICAL_CREAM | Freq: Two times a day (BID) | CUTANEOUS | 0 refills | Status: AC

## 2024-01-05 NOTE — ED Triage Notes (Signed)
 Pt c/o rash on right armx4d. Pt denies new meds, cleaning products, or hygiene products. Pt state it itches and is sore. Pt has an area of red raised bumps in a big cluster on posterior right upper arm above elbow.

## 2024-01-05 NOTE — Discharge Instructions (Addendum)
 Start triamcinolone topically to the rash area twice daily.  He may continue over-the-counter Benadryl  as needed.  Please follow-up with your PCP if your symptoms do not improve.  Please go to the ER for any worsening symptoms.  Hope you feel better soon!

## 2024-01-05 NOTE — ED Provider Notes (Signed)
 [facility]    CSN: [id number] Arrival date & time: 01/05/24  0850      History   Chief Complaint No chief complaint on file.   HPI Lynn Pearson is a 48 y.o. female presents for rash.  Patient has a past medical history of hypertension, diabetes, PCOS, obesity who presents for a pruritic rash on her right elbow.  States has been there for about 5 days.  Denies any drainage, swelling, fevers chills, warmth or erythema.  No new contacts including soaps, medications, detergents, etc.  Denies history of eczema or psoriasis.  Has been taking Benadryl  OTC with minimal improvement.  No other concerns at this time.  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   Iron  deficiency anemia due to chronic blood loss 07/06/2012   HTN (hypertension) 07/06/2012   Obesity 07/06/2012    Past Surgical History:  Procedure Laterality Date   DIAGNOSTIC LAPAROSCOPY     ECTOPIC PREGNANCY SURGERY     HYSTEROSCOPY WITH D & C  04/12/2012   Procedure: DILATATION AND CURETTAGE /HYSTEROSCOPY;  Surgeon: Hamp Levine, MD;  Location: [facility];  Service: Gynecology;  Laterality: N/A;    OB History     Gravida  8   Para  4   Term  4   Preterm      AB  4   Living  4      SAB  2   IAB  1   Ectopic  1   Multiple      Live Births               Home Medications    Prior to Admission medications   Medication Sig Start Date End Date Taking? Authorizing Provider  triamcinolone cream (KENALOG) 0.1 % Apply 1 Application topically 2  (two) times daily. 01/05/24  Yes Suheyb Raucci, Jodi R, NP  amLODipine (NORVASC) 10 MG tablet  02/17/19   [provider]  buPROPion (WELLBUTRIN XL) 150 MG 24 hr tablet Take 150 mg by mouth every morning.    [provider]  cloNIDine (CATAPRES) 0.1 MG tablet Take by mouth. 01/26/18   [provider]  escitalopram (LEXAPRO) 10 MG tablet Take 10 mg by mouth daily.    [provider]  fluticasone  (FLONASE ) 50 MCG/ACT nasal spray Place 1 spray into both nostrils daily. 11/24/23   Rexanne Inocencio, Jodi R, NP  glipiZIDE (GLUCOTROL XL) 5 MG 24 hr tablet Take 5 mg by mouth daily.    [provider]  hydrochlorothiazide (HYDRODIURIL) 25 MG tablet Take 25 mg by mouth daily.    [provider]  hydrOXYzine (ATARAX) 25 MG tablet Take 25 mg by mouth 3 (three) times daily as needed.    [provider]  hydrOXYzine (ATARAX/VISTARIL) 10 MG tablet Take by mouth. 02/17/19   [provider]  labetalol (NORMODYNE) 200 MG tablet Take by mouth. 01/19/17   [provider]  liraglutide  (VICTOZA ) 18 MG/3ML SOPN Inject  under skin 1.8 mg before b'fast 02/21/19   Gherghe, Cristina, MD  loratadine (CLARITIN) 10 MG tablet Take 10 mg by mouth daily.    [provider]  metFORMIN  (GLUCOPHAGE ) 500 MG tablet Take 1 tablet (500 mg total) by mouth 2 (two) times daily with a meal. 02/21/19   Emilie Harden, MD  OZEMPIC, 0.25 OR 0.5 MG/DOSE, 2 MG/3ML SOPN Inject into the skin. 07/20/23   [provider]  Vitamin D, Ergocalciferol, (DRISDOL) 1.25 MG (50000 UNIT) CAPS capsule Take 50,000 Units by mouth once a week.    [provider]  ferrous sulfate  325 (65 FE) MG tablet Take 1 tablet (325 mg total) by mouth 2 (two) times daily. 07/07/12 06/23/13  Harden Leyden, MD  medroxyPROGESTERone  (PROVERA ) 5 MG tablet Take 5 mg by mouth daily.  06/23/13  [provider]    Family History Family History  Problem Relation Age of Onset   Cancer Other     COPD Other    Heart disease Other    Hyperlipidemia Other    Stroke Other    Obesity Other     Social History Social History   Tobacco Use   Smoking status: Never   Smokeless tobacco: Never  Vaping Use   Vaping status: Never Used  Substance Use Topics   Alcohol use: No   Drug use: No     Allergies   Patient has no known allergies.   Review of Systems Review of Systems  Skin:  Positive for rash.     Physical Exam Triage Vital Signs ED Triage Vitals  Encounter Vitals Group     BP 01/05/24 0921 (!) 138/92     Systolic BP Percentile --      Diastolic BP Percentile --      Pulse Rate 01/05/24 0921 76     Resp 01/05/24 0921 16     Temp 01/05/24 0921 (!) 97.5 F (36.4 C)     Temp Source 01/05/24 0921 Oral     SpO2 01/05/24 0921 96 %     Weight --      Height --      Head Circumference --      Peak Flow --      Pain Score 01/05/24 0919 6     Pain Loc --      Pain Education --      Exclude from Growth Chart --    No data found.  Updated Vital Signs BP (!) 138/92   Pulse 76   Temp (!) 97.5 F (36.4 C) (Oral)   Resp 16   LMP 10/22/2023   SpO2 96%   Visual Acuity Right Eye Distance:   Left Eye Distance:   Bilateral Distance:    Right Eye Near:   Left Eye Near:    Bilateral Near:     Physical Exam Vitals and nursing note reviewed.  Constitutional:      General: She is not in acute distress.    Appearance: Normal appearance. She is not ill-appearing.  HENT:     Head: Normocephalic and atraumatic.  Eyes:     Pupils: Pupils are equal, round, and reactive to light.  Cardiovascular:     Rate and Rhythm: Normal rate.  Pulmonary:     Effort: Pulmonary effort is normal.  Skin:    General: Skin is warm and dry.     Findings: Rash is not crusting, nodular, purpuric, pustular, scaling, urticarial or vesicular.  Comments: Skin colored papular rash to the posterior right upper arm adjacent to elbow.  No swelling, drainage, warmth.  See photo   Neurological:     General: No focal deficit present.     Mental Status: She is alert and oriented to person, place, and time.  Psychiatric:        Mood and Affect: Mood normal.        Behavior: Behavior normal.      UC Treatments / Results  Labs (all labs ordered are listed, but only abnormal results are displayed) Labs Reviewed - No data to display  EKG   Radiology No results found.  Procedures Procedures (including critical care time)  Medications Ordered in UC Medications - No data to display  Initial Impression / Assessment and Plan / UC Course  I have reviewed the triage vital signs and the nursing notes.  Pertinent labs & imaging results that were available during my care of the patient were reviewed by me and considered in my medical decision making (see chart for details).     Reviewed exam and symptoms with patient.  No red flags.  Unclear cause of rash, will do trial of triamcinolone topically twice daily.  May continue Benadryl  OTC as needed.  PCP follow-up if symptoms do not improve.  ER precautions reviewed and patient verbalized understanding. Final Clinical Impressions(s) / UC Diagnoses   Final diagnoses:  Rash and nonspecific skin eruption     Discharge Instructions      Start triamcinolone topically to the rash area twice daily.  He may continue over-the-counter Benadryl  as needed.  Please follow-up with your PCP if your symptoms do not improve.  Please go to the ER for any worsening symptoms.  Hope you feel better soon!    ED Prescriptions     Medication Sig Dispense Auth. Provider   triamcinolone cream (KENALOG) 0.1 % Apply 1 Application topically 2 (two) times daily. 30 g Odaly Peri, Jodi R, NP      PDMP not reviewed this encounter.   Alleen Arbour, NP 01/05/24 [phone]

## 2024-11-15 ENCOUNTER — Ambulatory Visit: Admitting: Dermatology
